# Patient Record
Sex: Male | Born: 1955
Health system: Southern US, Community
[De-identification: ages and names within clinical notes are randomized; demographics above are authoritative.]

## PROBLEM LIST (undated history)

## (undated) DIAGNOSIS — I10 Essential (primary) hypertension: Secondary | ICD-10-CM

## (undated) DIAGNOSIS — E785 Hyperlipidemia, unspecified: Secondary | ICD-10-CM

## (undated) DIAGNOSIS — K5790 Diverticulosis of intestine, part unspecified, without perforation or abscess without bleeding: Secondary | ICD-10-CM

## (undated) DIAGNOSIS — E1165 Type 2 diabetes mellitus with hyperglycemia: Secondary | ICD-10-CM

## (undated) DIAGNOSIS — M1990 Unspecified osteoarthritis, unspecified site: Secondary | ICD-10-CM

## (undated) DIAGNOSIS — IMO0002 Reserved for concepts with insufficient information to code with codable children: Secondary | ICD-10-CM

## (undated) HISTORY — DX: Type 2 diabetes mellitus with hyperglycemia: E11.65

## (undated) HISTORY — DX: Essential (primary) hypertension: I10

## (undated) HISTORY — DX: Reserved for concepts with insufficient information to code with codable children: IMO0002

## (undated) HISTORY — DX: Diverticulosis of intestine, part unspecified, without perforation or abscess without bleeding: K57.90

## (undated) HISTORY — DX: Unspecified osteoarthritis, unspecified site: M19.90

## (undated) HISTORY — DX: Hyperlipidemia, unspecified: E78.5

---

## 2005-07-30 ENCOUNTER — Ambulatory Visit (HOSPITAL_COMMUNITY): Admission: RE | Admit: 2005-07-30 | Discharge: 2005-07-30 | Payer: Self-pay | Admitting: Family Medicine

## 2009-06-22 ENCOUNTER — Emergency Department (HOSPITAL_COMMUNITY): Admission: EM | Admit: 2009-06-22 | Discharge: 2009-06-22 | Payer: Self-pay | Admitting: Pediatric Emergency Medicine

## 2009-06-25 ENCOUNTER — Emergency Department (HOSPITAL_COMMUNITY): Admission: EM | Admit: 2009-06-25 | Discharge: 2009-06-25 | Payer: Self-pay | Admitting: Family Medicine

## 2009-06-30 ENCOUNTER — Emergency Department (HOSPITAL_COMMUNITY): Admission: EM | Admit: 2009-06-30 | Discharge: 2009-06-30 | Payer: Self-pay | Admitting: Emergency Medicine

## 2009-07-06 ENCOUNTER — Emergency Department (HOSPITAL_COMMUNITY): Admission: EM | Admit: 2009-07-06 | Discharge: 2009-07-06 | Payer: Self-pay | Admitting: Family Medicine

## 2010-07-15 LAB — HM COLONOSCOPY

## 2012-09-26 ENCOUNTER — Telehealth: Payer: Self-pay | Admitting: Physician Assistant

## 2012-09-26 DIAGNOSIS — M1990 Unspecified osteoarthritis, unspecified site: Secondary | ICD-10-CM

## 2012-09-26 MED ORDER — CELECOXIB 100 MG PO CAPS: 100.0000 mg | ORAL_CAPSULE | Freq: Every day | ORAL | Status: DC

## 2012-09-26 NOTE — Telephone Encounter (Signed)
Medication refilled per protocol.Patient needs to be seen before any further refills 

## 2012-10-24 ENCOUNTER — Other Ambulatory Visit: Payer: Self-pay | Admitting: Physician Assistant

## 2012-10-25 ENCOUNTER — Telehealth: Payer: Self-pay | Admitting: Physician Assistant

## 2012-10-25 DIAGNOSIS — M1990 Unspecified osteoarthritis, unspecified site: Secondary | ICD-10-CM

## 2012-10-25 MED ORDER — CELECOXIB 100 MG PO CAPS: 100.0000 mg | ORAL_CAPSULE | Freq: Every day | ORAL | Status: DC

## 2012-10-25 NOTE — Telephone Encounter (Signed)
Medication refilled per protocol.Patient needs to be seen before any further refills 

## 2012-10-25 NOTE — Telephone Encounter (Signed)
celebrex 100mg  take 1 capsule by mouth daily last rf 09/26/12

## 2012-11-02 ENCOUNTER — Encounter: Payer: Self-pay | Admitting: Family Medicine

## 2012-11-02 ENCOUNTER — Ambulatory Visit (INDEPENDENT_AMBULATORY_CARE_PROVIDER_SITE_OTHER): Payer: BC Managed Care – PPO | Admitting: Family Medicine

## 2012-11-02 VITALS — BP 110/64 | HR 63 | Temp 98.0°F | Resp 16 | Wt 232.0 lb

## 2012-11-02 DIAGNOSIS — I10 Essential (primary) hypertension: Secondary | ICD-10-CM

## 2012-11-02 DIAGNOSIS — E785 Hyperlipidemia, unspecified: Secondary | ICD-10-CM

## 2012-11-02 DIAGNOSIS — M1990 Unspecified osteoarthritis, unspecified site: Secondary | ICD-10-CM

## 2012-11-02 DIAGNOSIS — R7303 Prediabetes: Secondary | ICD-10-CM

## 2012-11-02 DIAGNOSIS — R7309 Other abnormal glucose: Secondary | ICD-10-CM

## 2012-11-02 MED ORDER — VALSARTAN-HYDROCHLOROTHIAZIDE 160-25 MG PO TABS: 1.0000 | ORAL_TABLET | Freq: Every day | ORAL | Status: DC

## 2012-11-02 MED ORDER — ROSUVASTATIN CALCIUM 10 MG PO TABS: 10.0000 mg | ORAL_TABLET | Freq: Every day | ORAL | Status: DC

## 2012-11-02 MED ORDER — CELECOXIB 100 MG PO CAPS: 100.0000 mg | ORAL_CAPSULE | Freq: Every day | ORAL | Status: DC

## 2012-11-02 NOTE — Progress Notes (Signed)
Subjective:    Patient ID: James Ponce, male    DOB: December 21, 1955, 57 y.o.   MRN: [id number]  HPI Patient presents for followup of his prediabetes. At his last office visit he was found to have a fasting blood sugar of 131 with a hemoglobin A1c of 6.3. He took that seriously and has lost 11 pounds due to portion control, exercise, and limiting his carbs.  He denies polyuria, polydipsia, or blurred vision. He denies neuropathy in the feet  He also has hypertension which he is managing well with Diovan HCT 160/25 by mouth daily.  He denies chest pain shortness breath or dyspnea on exertion.  He also has hyperlipidemia which he manages with Crestor 10 mg by mouth daily. He denies any myalgias or right upper quadrant pain. Past Medical History  Diagnosis Date  . Hypertension   . Hyperlipidemia   . Arthritis   . Prediabetes   . Diverticulosis    Current Outpatient Prescriptions on File Prior to Visit  Medication Sig Dispense Refill  . azelastine (ASTELIN) 137 MCG/SPRAY nasal spray Place 2 sprays into the nose 2 (two) times daily. Use in each nostril as directed      . fish oil-omega-3 fatty acids 1000 MG capsule Take 3 g by mouth daily.      . fluticasone (FLONASE) 50 MCG/ACT nasal spray Place 1 spray into the nose daily. Each nostril      . loratadine (CLARITIN) 10 MG tablet Take 10 mg by mouth daily.      . Multiple Vitamin (MULTIVITAMIN) tablet Take 1 tablet by mouth daily.       No current facility-administered medications on file prior to visit.   Allergies  Allergen Reactions  . Lipitor (Atorvastatin) Other (See Comments)    myalgias  . Sulfa Antibiotics Rash   History   Social History  . Marital Status: Single    Spouse Name: N/A    Number of Children: N/A  . Years of Education: N/A   Occupational History  . Not on file.   Social History Main Topics  . Smoking status: Never Smoker   . Smokeless tobacco: Not on file  . Alcohol Use: No  . Drug Use: No  . Sexually  Active: Not on file   Other Topics Concern  . Not on file   Social History Narrative  . No narrative on file     Review of Systems    otherwise review of systems is negative Objective:   Physical Exam  Constitutional: He is oriented to person, place, and time. He appears well-developed and well-nourished.  Eyes: Conjunctivae and EOM are normal. Pupils are equal, round, and reactive to light.  Neck: Neck supple. No JVD present. No thyromegaly present.  Cardiovascular: Normal rate, normal heart sounds and intact distal pulses.  Exam reveals no gallop and no friction rub.   Pulmonary/Chest: Effort normal and breath sounds normal. No respiratory distress. He has no wheezes. He has no rales. He exhibits no tenderness.  Abdominal: Soft. Bowel sounds are normal. He exhibits no distension and no mass. There is no tenderness. There is no rebound and no guarding.  Lymphadenopathy:    He has no cervical adenopathy.  Neurological: He is alert and oriented to person, place, and time. No cranial nerve deficit. He exhibits normal muscle tone. Coordination normal.          Assessment & Plan:  1. Prediabetes Congratulated the patient on his weight loss. Recheck hemoglobin A1c. Emphasized a low  carbohydrate diet - Hemoglobin A1c  2. HTN (hypertension) Well-controlled no change. - Basic Metabolic Panel - Hepatic Function Panel  3. HLD (hyperlipidemia) Check a fasting lipid panel, his goal LDL is less than 100. - Hepatic Function Panel - Lipid Panel  4. Osteoarthritis Patient's Celebrex was refilled. - celecoxib (CELEBREX) 100 MG capsule; Take 1 capsule (100 mg total) by mouth daily.  Dispense: 30 capsule; Refill: 5

## 2012-11-03 LAB — LIPID PANEL
Triglycerides: 64 mg/dL (ref <150)
VLDL: 13 mg/dL (ref 0–40)

## 2012-11-03 LAB — BASIC METABOLIC PANEL
BUN: 10 mg/dL (ref 6–23)
Potassium: 4.3 mEq/L (ref 3.5–5.3)
Sodium: 140 mEq/L (ref 135–145)

## 2012-11-03 LAB — HEPATIC FUNCTION PANEL
AST: 17 U/L (ref 0–37)
Albumin: 4.6 g/dL (ref 3.5–5.2)
Alkaline Phosphatase: 70 U/L (ref 39–117)
Total Bilirubin: 0.6 mg/dL (ref 0.3–1.2)
Total Protein: 7.3 g/dL (ref 6.0–8.3)

## 2012-12-13 ENCOUNTER — Emergency Department (HOSPITAL_COMMUNITY)
Admission: EM | Admit: 2012-12-13 | Discharge: 2012-12-14 | Disposition: A | Discharge status: Home / Self Care | Payer: BC Managed Care – PPO | Attending: Emergency Medicine | Admitting: Emergency Medicine

## 2012-12-13 ENCOUNTER — Encounter (HOSPITAL_COMMUNITY): Payer: Self-pay | Admitting: Adult Health

## 2012-12-13 ENCOUNTER — Emergency Department (HOSPITAL_COMMUNITY): Payer: BC Managed Care – PPO

## 2012-12-13 DIAGNOSIS — Y929 Unspecified place or not applicable: Secondary | ICD-10-CM | POA: Insufficient documentation

## 2012-12-13 DIAGNOSIS — Z8719 Personal history of other diseases of the digestive system: Secondary | ICD-10-CM | POA: Insufficient documentation

## 2012-12-13 DIAGNOSIS — Z8739 Personal history of other diseases of the musculoskeletal system and connective tissue: Secondary | ICD-10-CM | POA: Insufficient documentation

## 2012-12-13 DIAGNOSIS — W268XXA Contact with other sharp object(s), not elsewhere classified, initial encounter: Secondary | ICD-10-CM | POA: Insufficient documentation

## 2012-12-13 DIAGNOSIS — I10 Essential (primary) hypertension: Secondary | ICD-10-CM | POA: Insufficient documentation

## 2012-12-13 DIAGNOSIS — Y9389 Activity, other specified: Secondary | ICD-10-CM | POA: Insufficient documentation

## 2012-12-13 DIAGNOSIS — S92919B Unspecified fracture of unspecified toe(s), initial encounter for open fracture: Secondary | ICD-10-CM | POA: Insufficient documentation

## 2012-12-13 DIAGNOSIS — S92911B Unspecified fracture of right toe(s), initial encounter for open fracture: Secondary | ICD-10-CM

## 2012-12-13 DIAGNOSIS — Y999 Unspecified external cause status: Secondary | ICD-10-CM | POA: Insufficient documentation

## 2012-12-13 DIAGNOSIS — E785 Hyperlipidemia, unspecified: Secondary | ICD-10-CM | POA: Insufficient documentation

## 2012-12-13 DIAGNOSIS — Z7982 Long term (current) use of aspirin: Secondary | ICD-10-CM | POA: Insufficient documentation

## 2012-12-13 DIAGNOSIS — E119 Type 2 diabetes mellitus without complications: Secondary | ICD-10-CM | POA: Insufficient documentation

## 2012-12-13 DIAGNOSIS — Z79899 Other long term (current) drug therapy: Secondary | ICD-10-CM | POA: Insufficient documentation

## 2012-12-13 NOTE — ED Notes (Signed)
Presents with laceration to right great toe, bleeding controlled. CMS intact. Last tetnus unknown.

## 2012-12-13 NOTE — ED Provider Notes (Signed)
History     CSN: [id number]  Arrival date & time 12/13/12  2212   First MD Initiated Contact with Patient 12/13/12 2249      Chief Complaint  Patient presents with  . Laceration    (Consider location/radiation/quality/duration/timing/severity/associated sxs/prior treatment) HPI Comments: Patient presents emergency department with chief complaint of right great toe laceration. States that he was using a gr auger, when his foot became stuck in the blade, and cut his right great toe. He was wearing still toed boots at the time. States that his pain is mild. He does not want anything for pain. States that he is tried pouring peroxide on the cut. States that it continues to bleed. Last tetanus shot is unknown.  The history is provided by the patient. No language interpreter was used.    Past Medical History  Diagnosis Date  . Hypertension   . Hyperlipidemia   . Arthritis   . Prediabetes   . Diverticulosis     History reviewed. No pertinent past surgical history.  History reviewed. No pertinent family history.  History  Substance Use Topics  . Smoking status: Never Smoker   . Smokeless tobacco: Not on file  . Alcohol Use: No      Review of Systems  All other systems reviewed and are negative.    Allergies  Lipitor and Sulfa antibiotics  Home Medications   Current Outpatient Rx  Name  Route  Sig  Dispense  Refill  . aspirin EC 81 MG tablet   Oral   Take 81 mg by mouth daily.         Marland Kitchen azelastine (ASTELIN) 137 MCG/SPRAY nasal spray   Nasal   Place 2 sprays into the nose 2 (two) times daily. Use in each nostril as directed         . celecoxib (CELEBREX) 100 MG capsule   Oral   Take 1 capsule (100 mg total) by mouth daily.   30 capsule   5   . fish oil-omega-3 fatty acids 1000 MG capsule   Oral   Take 1-2 g by mouth 2 (two) times daily. 2 capsules in morning, and 1 in evening, daily         . fluticasone (FLONASE) 50 MCG/ACT nasal spray   Nasal  Place 2 sprays into the nose daily. Each nostril         . Glucosamine-Chondroitin (GLUCOSAMINE CHONDR COMPLEX PO)   Oral   Take 1 tablet by mouth 2 (two) times daily.         Marland Kitchen loratadine (CLARITIN) 10 MG tablet   Oral   Take 10 mg by mouth daily.         . Multiple Vitamin (MULTIVITAMIN) tablet   Oral   Take 1 tablet by mouth daily.         . RED YEAST RICE EXTRACT PO   Oral   Take 500 mg by mouth 2 (two) times daily.          . rosuvastatin (CRESTOR) 10 MG tablet   Oral   Take 1 tablet (10 mg total) by mouth daily.   30 tablet   5   . valsartan-hydrochlorothiazide (DIOVAN-HCT) 160-25 MG per tablet   Oral   Take 1 tablet by mouth daily.   30 tablet   5     BP 148/76  Pulse 96  Temp(Src) 98.2 F (36.8 C) (Oral)  Resp 16  SpO2 96%  Physical Exam  Nursing note and vitals  reviewed. Constitutional: He is oriented to person, place, and time. He appears well-developed and well-nourished.  HENT:  Head: Normocephalic and atraumatic.  Eyes: Conjunctivae and EOM are normal.  Neck: Normal range of motion.  Cardiovascular: Normal rate.   Pulmonary/Chest: Effort normal.  Abdominal: He exhibits no distension.  Musculoskeletal: Normal range of motion.  Right great toe range of motion 5/5, strength 5/5  Neurological: He is alert and oriented to person, place, and time.  Skin: Skin is dry.  (2) 2 cm lacerations to the right great toe posteriorly, no evidence of foreign bodies, no tendon involvement  Psychiatric: He has a normal mood and affect. His behavior is normal. Judgment and thought content normal.    ED Course  Procedures (including critical care time)  Results for orders placed in visit on 11/02/12  BASIC METABOLIC PANEL      Result Value Range   Sodium 140  135 - 145 mEq/L   Potassium 4.3  3.5 - 5.3 mEq/L   Chloride 101  96 - 112 mEq/L   CO2 30  19 - 32 mEq/L   Glucose, Bld 122 (*) 70 - 99 mg/dL   BUN 10  6 - 23 mg/dL   Creat 5.78  4.69 - 6.29  mg/dL   Calcium 9.5  8.4 - 52.8 mg/dL  HEPATIC FUNCTION PANEL      Result Value Range   Total Bilirubin 0.6  0.3 - 1.2 mg/dL   Bilirubin, Direct 0.2  0.0 - 0.3 mg/dL   Indirect Bilirubin 0.4  0.0 - 0.9 mg/dL   Alkaline Phosphatase 70  39 - 117 U/L   AST 17  0 - 37 U/L   ALT 21  0 - 53 U/L   Total Protein 7.3  6.0 - 8.3 g/dL   Albumin 4.6  3.5 - 5.2 g/dL  HEMOGLOBIN U1L      Result Value Range   Hemoglobin A1C 5.6  <5.7 %   Mean Plasma Glucose 114  <117 mg/dL  LIPID PANEL      Result Value Range   Cholesterol 114  0 - 200 mg/dL   Triglycerides 64  <244 mg/dL   HDL 53  >01 mg/dL   Total CHOL/HDL Ratio 2.2     VLDL 13  0 - 40 mg/dL   LDL Cholesterol 48  0 - 99 mg/dL   Dg Foot Complete Right  12/14/2012   *RADIOLOGY REPORT*  Clinical Data: Injury to right first toe.  RIGHT FOOT COMPLETE - 3+ VIEW  Comparison: None.  Findings: Crush injury to the right first toe is noted with significant comminution of the distal phalanx.  Multiple fracture fragments show displacement and in the lateral projection, bone lies very near the surface of the skin dorsally at roughly the base of the first toe nail.  Correlation is suggested with any evidence clinically of an open fracture.  No soft tissue foreign body is identified.  IMPRESSION: Severely comminuted fracture of the first distal phalanx.  There is displacement of fracture fragments with one of the bony fragments very near the surface of the skin at the level of the base of the first toe nail.   Original Report Authenticated By: Irish Lack, M.D.      1. Open fracture of toe of right foot, initial encounter       MDM   Patient with open right great toe fracture. Patient seen by and discussed with Dr. Norlene Campbell.  Recommends ortho consult.  I spoke with Dr.  Ophelia Charter, who recommends thorough irrigation in the ED, and then outpatient follow-up in his office.  I irrigated the wound with  2 liters and high pressure.  No foreign bodies are seen.  Will  discharge with cipro and keflex.  Will give pain medicine.  Post op shoe and crutches.  Follow-up with Dr. Ophelia Charter tomorrow.       Roxy Horseman, PA-C 12/14/12 [phone]

## 2012-12-14 MED ORDER — CIPROFLOXACIN HCL 500 MG PO TABS
ORAL_TABLET | ORAL | Status: AC
  Filled 2012-12-14: qty 1

## 2012-12-14 MED ORDER — OXYCODONE-ACETAMINOPHEN 5-325 MG PO TABS
ORAL_TABLET | ORAL | Status: AC
  Filled 2012-12-14: qty 2

## 2012-12-14 NOTE — ED Provider Notes (Signed)
Medical screening examination/treatment/procedure(s) were conducted as a shared visit with non-physician practitioner(s) and myself.  I personally evaluated the patient during the encounter.  Pt with injury to right great toe from auger drill.  Fracture of distal phalax, appears to be open fracture.  D/w Ophelia Charter, who wishes good ED washout and no closure, f/u in clinic.  Olivia Mackie, MD 12/14/12 [phone]

## 2013-05-21 ENCOUNTER — Other Ambulatory Visit: Payer: Self-pay | Admitting: Family Medicine

## 2013-09-10 ENCOUNTER — Other Ambulatory Visit: Payer: Self-pay | Admitting: Family Medicine

## 2013-09-18 ENCOUNTER — Other Ambulatory Visit: Payer: Self-pay | Admitting: Family Medicine

## 2013-09-19 ENCOUNTER — Encounter: Payer: Self-pay | Admitting: Family Medicine

## 2013-09-19 NOTE — Telephone Encounter (Signed)
Medication refill for one time only.  Patient needs to be seen.  Letter sent for patient to call and schedule 

## 2013-09-20 ENCOUNTER — Other Ambulatory Visit: Payer: Self-pay | Admitting: Family Medicine

## 2013-09-20 NOTE — Telephone Encounter (Signed)
Refill appropriate and filled per protocol. 

## 2013-11-14 ENCOUNTER — Other Ambulatory Visit: Payer: Self-pay | Admitting: Family Medicine

## 2013-11-14 MED ORDER — CELECOXIB 100 MG PO CAPS: ORAL_CAPSULE | ORAL | Status: DC

## 2013-11-14 NOTE — Telephone Encounter (Signed)
Med refilled x 1 - pt needs office visit letter was mailed out in March for pt to make appt. Pt must make appt before any further refills

## 2013-11-29 ENCOUNTER — Encounter: Payer: Self-pay | Admitting: Family Medicine

## 2013-11-29 ENCOUNTER — Other Ambulatory Visit: Payer: Self-pay | Admitting: Family Medicine

## 2013-11-29 MED ORDER — ROSUVASTATIN CALCIUM 10 MG PO TABS: ORAL_TABLET | ORAL | Status: DC

## 2013-11-29 NOTE — Telephone Encounter (Signed)
Rx Refilled x 1 month will send letter for appt

## 2013-12-06 ENCOUNTER — Other Ambulatory Visit: Payer: BC Managed Care – PPO

## 2013-12-06 ENCOUNTER — Other Ambulatory Visit: Payer: Self-pay | Admitting: Family Medicine

## 2013-12-06 DIAGNOSIS — I10 Essential (primary) hypertension: Secondary | ICD-10-CM

## 2013-12-06 DIAGNOSIS — E785 Hyperlipidemia, unspecified: Secondary | ICD-10-CM

## 2013-12-06 DIAGNOSIS — R7303 Prediabetes: Secondary | ICD-10-CM

## 2013-12-06 DIAGNOSIS — Z79899 Other long term (current) drug therapy: Secondary | ICD-10-CM

## 2013-12-06 LAB — CBC WITH DIFFERENTIAL/PLATELET
BASOS ABS: 0.1 10*3/uL (ref 0.0–0.1)
Basophils Relative: 1 % (ref 0–1)
EOS PCT: 5 % (ref 0–5)
Eosinophils Absolute: 0.3 10*3/uL (ref 0.0–0.7)
HCT: 43.8 % (ref 39.0–52.0)
HEMOGLOBIN: 15.4 g/dL (ref 13.0–17.0)
LYMPHS ABS: 1.5 10*3/uL (ref 0.7–4.0)
LYMPHS PCT: 29 % (ref 12–46)
MCH: 29.8 pg (ref 26.0–34.0)
MCHC: 35.2 g/dL (ref 30.0–36.0)
MCV: 84.9 fL (ref 78.0–100.0)
MONO ABS: 0.4 10*3/uL (ref 0.1–1.0)
Monocytes Relative: 7 % (ref 3–12)
NEUTROS ABS: 3 10*3/uL (ref 1.7–7.7)
Neutrophils Relative %: 58 % (ref 43–77)
PLATELETS: 202 10*3/uL (ref 150–400)
RBC: 5.16 MIL/uL (ref 4.22–5.81)
RDW: 13.7 % (ref 11.5–15.5)
WBC: 5.2 10*3/uL (ref 4.0–10.5)

## 2013-12-06 LAB — COMPREHENSIVE METABOLIC PANEL
ALBUMIN: 4.1 g/dL (ref 3.5–5.2)
ALT: 33 U/L (ref 0–53)
AST: 22 U/L (ref 0–37)
Alkaline Phosphatase: 66 U/L (ref 39–117)
BUN: 18 mg/dL (ref 6–23)
CHLORIDE: 103 meq/L (ref 96–112)
CO2: 26 meq/L (ref 19–32)
CREATININE: 0.92 mg/dL (ref 0.50–1.35)
Calcium: 9.3 mg/dL (ref 8.4–10.5)
GLUCOSE: 131 mg/dL — AB (ref 70–99)
Potassium: 3.8 mEq/L (ref 3.5–5.3)
SODIUM: 139 meq/L (ref 135–145)
TOTAL PROTEIN: 6.5 g/dL (ref 6.0–8.3)
Total Bilirubin: 0.4 mg/dL (ref 0.2–1.2)

## 2013-12-06 LAB — HEMOGLOBIN A1C
Hgb A1c MFr Bld: 6.2 % — ABNORMAL HIGH (ref <5.7)
MEAN PLASMA GLUCOSE: 131 mg/dL — AB (ref <117)

## 2013-12-06 LAB — LIPID PANEL
CHOL/HDL RATIO: 3 ratio
Cholesterol: 145 mg/dL (ref 0–200)
HDL: 49 mg/dL (ref >39)
LDL CALC: 72 mg/dL (ref 0–99)
Triglycerides: 121 mg/dL (ref <150)
VLDL: 24 mg/dL (ref 0–40)

## 2013-12-07 ENCOUNTER — Ambulatory Visit (INDEPENDENT_AMBULATORY_CARE_PROVIDER_SITE_OTHER): Payer: BC Managed Care – PPO | Admitting: Family Medicine

## 2013-12-07 ENCOUNTER — Encounter: Payer: Self-pay | Admitting: Family Medicine

## 2013-12-07 VITALS — BP 140/70 | HR 78 | Temp 97.0°F | Resp 16 | Ht 69.0 in | Wt 248.0 lb

## 2013-12-07 DIAGNOSIS — R7303 Prediabetes: Secondary | ICD-10-CM

## 2013-12-07 DIAGNOSIS — E785 Hyperlipidemia, unspecified: Secondary | ICD-10-CM

## 2013-12-07 DIAGNOSIS — R7309 Other abnormal glucose: Secondary | ICD-10-CM

## 2013-12-07 DIAGNOSIS — I10 Essential (primary) hypertension: Secondary | ICD-10-CM

## 2013-12-07 MED ORDER — FLUTICASONE PROPIONATE 50 MCG/ACT NA SUSP: NASAL | Status: DC

## 2013-12-07 MED ORDER — ROSUVASTATIN CALCIUM 10 MG PO TABS: ORAL_TABLET | ORAL | Status: DC

## 2013-12-07 MED ORDER — LORATADINE 10 MG PO TABS
10.0000 mg | ORAL_TABLET | Freq: Every day | ORAL | Status: AC
Start: 2013-12-07 — End: ?

## 2013-12-07 MED ORDER — VALSARTAN-HYDROCHLOROTHIAZIDE 160-25 MG PO TABS: ORAL_TABLET | ORAL | Status: DC

## 2013-12-07 MED ORDER — AZELASTINE HCL 0.1 % NA SOLN: NASAL | Status: DC

## 2013-12-07 MED ORDER — CELECOXIB 100 MG PO CAPS
ORAL_CAPSULE | ORAL | Status: DC
Start: 2013-12-07 — End: 2014-10-29

## 2013-12-07 NOTE — Progress Notes (Signed)
Subjective:    Patient ID: James Ponce, male    DOB: [birth year], 57 y.o.   MRN: [id number]  HPI 11/02/13 Patient presents for followup of his prediabetes. At his last office visit he was found to have a fasting blood sugar of 131 with a hemoglobin A1c of 6.3. He took that seriously and has lost 11 pounds due to portion control, exercise, and limiting his carbs.  He denies polyuria, polydipsia, or blurred vision. He denies neuropathy in the feet.  He also has hypertension which he is managing well with Diovan HCT 160/25 by mouth daily.  He denies chest pain shortness breath or dyspnea on exertion.  He also has hyperlipidemia which he manages with Crestor 10 mg by mouth daily. He denies any myalgias or right upper quadrant pain.  At that time, my plan was: 1. Prediabetes Congratulated the patient on his weight loss. Recheck hemoglobin A1c. Emphasized a low carbohydrate diet - Hemoglobin A1c  2. HTN (hypertension) Well-controlled no change. - Basic Metabolic Panel - Hepatic Function Panel  3. HLD (hyperlipidemia) Check a fasting lipid panel, his goal LDL is less than 100. - Hepatic Function Panel - Lipid Panel  4. Osteoarthritis Patient's Celebrex was refilled. - celecoxib (CELEBREX) 100 MG capsule; Take 1 capsule (100 mg total) by mouth daily.  Dispense: 30 capsule; Refill: 5  12/07/13 He is here today for follow up.  Unfortunately, he has gained 16 pounds since his last ov.  His most recent labs are listed below: Lab on 12/06/2013  Component Date Value Ref Range Status  . WBC 12/06/2013 5.2  4.0 - 10.5 K/uL Final  . RBC 12/06/2013 5.16  4.22 - 5.81 MIL/uL Final  . Hemoglobin 12/06/2013 15.4  13.0 - 17.0 g/dL Final  . HCT 12/06/2013 43.8  39.0 - 52.0 % Final  . MCV 12/06/2013 84.9  78.0 - 100.0 fL Final  . MCH 12/06/2013 29.8  26.0 - 34.0 pg Final  . MCHC 12/06/2013 35.2  30.0 - 36.0 g/dL Final  . RDW 12/06/2013 13.7  11.5 - 15.5 % Final  . Platelets 12/06/2013 202  150 - 400  K/uL Final  . Neutrophils Relative % 12/06/2013 58  43 - 77 % Final  . Neutro Abs 12/06/2013 3.0  1.7 - 7.7 K/uL Final  . Lymphocytes Relative 12/06/2013 29  12 - 46 % Final  . Lymphs Abs 12/06/2013 1.5  0.7 - 4.0 K/uL Final  . Monocytes Relative 12/06/2013 7  3 - 12 % Final  . Monocytes Absolute 12/06/2013 0.4  0.1 - 1.0 K/uL Final  . Eosinophils Relative 12/06/2013 5  0 - 5 % Final  . Eosinophils Absolute 12/06/2013 0.3  0.0 - 0.7 K/uL Final  . Basophils Relative 12/06/2013 1  0 - 1 % Final  . Basophils Absolute 12/06/2013 0.1  0.0 - 0.1 K/uL Final  . Smear Review 12/06/2013 Criteria for review not met   Final  . Sodium 12/06/2013 139  135 - 145 mEq/L Final  . Potassium 12/06/2013 3.8  3.5 - 5.3 mEq/L Final  . Chloride 12/06/2013 103  96 - 112 mEq/L Final  . CO2 12/06/2013 26  19 - 32 mEq/L Final  . Glucose, Bld 12/06/2013 131* 70 - 99 mg/dL Final  . BUN 12/06/2013 18  6 - 23 mg/dL Final  . Creat 12/06/2013 0.92  0.50 - 1.35 mg/dL Final  . Total Bilirubin 12/06/2013 0.4  0.2 - 1.2 mg/dL Final  . Alkaline Phosphatase 12/06/2013 66  39 - 117  U/L Final  . AST 12/06/2013 22  0 - 37 U/L Final  . ALT 12/06/2013 33  0 - 53 U/L Final  . Total Protein 12/06/2013 6.5  6.0 - 8.3 g/dL Final  . Albumin 12/06/2013 4.1  3.5 - 5.2 g/dL Final  . Calcium 12/06/2013 9.3  8.4 - 10.5 mg/dL Final  . Cholesterol 12/06/2013 145  0 - 200 mg/dL Final   Comment: ATP III Classification:                                < 200        mg/dL        Desirable                               200 - 239     mg/dL        Borderline High                               >= 240        mg/dL        High                             . Triglycerides 12/06/2013 121  <150 mg/dL Final  . HDL 12/06/2013 49  >39 mg/dL Final  . Total CHOL/HDL Ratio 12/06/2013 3.0   Final  . VLDL 12/06/2013 24  0 - 40 mg/dL Final  . LDL Cholesterol 12/06/2013 72  0 - 99 mg/dL Final   Comment:                            Total Cholesterol/HDL  Ratio:CHD Risk                                                 Coronary Heart Disease Risk Table                                                                 Men       Women                                   1/2 Average Risk              3.4        3.3                                       Average Risk              5.0        4.4  2X Average Risk              9.6        7.1                                    3X Average Risk             23.4       11.0                          Use the calculated Patient Ratio above and the CHD Risk table                           to determine the patient's CHD Risk.                          ATP III Classification (LDL):                                < 100        mg/dL         Optimal                               100 - 129     mg/dL         Near or Above Optimal                               130 - 159     mg/dL         Borderline High                               160 - 189     mg/dL         High                                > 190        mg/dL         Very High                             . Hemoglobin A1C 12/06/2013 6.2* <5.7 % Final   Comment:                                                                                                 According to the ADA Clinical Practice Recommendations for 2011, when                          HbA1c is used as  a screening test:                                                       >=6.5%   Diagnostic of Diabetes Mellitus                                     (if abnormal result is confirmed)                                                     5.7-6.4%   Increased risk of developing Diabetes Mellitus                                                     References:Diagnosis and Classification of Diabetes Mellitus,Diabetes                          EXHB,7169,67(ELFYB 1):S62-S69 and Standards of Medical Care in                                  Diabetes - 2011,Diabetes OFBP,1025,85  (Suppl 1):S11-S61.                             . Mean Plasma Glucose 12/06/2013 131* <117 mg/dL Final   patient has slipped on his diet.  He has not been watching his carbohydrate intake. He has not been trying to achieve regular aerobic exercise. While his cholesterol is still excellent, his triglycerides and LDL have increased substantially over the last year. His colonoscopy is up-to-date. He is overdue for a PSA. He denies any chest pain shortness of breath or dyspnea on exertion. He denies any myalgias or right upper quadrant pain. His biggest problem is arthritis in his hands for which he takes Celebrex. This provides him some minimal relief   Past Medical History  Diagnosis Date  . Hypertension   . Hyperlipidemia   . Arthritis   . Prediabetes   . Diverticulosis    Current Outpatient Prescriptions on File Prior to Visit  Medication Sig Dispense Refill  . aspirin EC 81 MG tablet Take 81 mg by mouth daily.      Marland Kitchen azelastine (ASTELIN) 137 MCG/SPRAY nasal spray USE 2 SPRAYS IN EACH NOSTRIL TWICE DAILY  30 mL  0  . celecoxib (CELEBREX) 100 MG capsule TAKE 1 CAPSULE (100 MG TOTAL) BY MOUTH DAILY.  30 capsule  0  . fish oil-omega-3 fatty acids 1000 MG capsule Take 1-2 g by mouth 2 (two) times daily. 2 capsules in morning, and 1 in evening, daily      . fluticasone (FLONASE) 50 MCG/ACT nasal spray USE 2 SPRAYS EACH NOSTRIL DAILY  16 g  11  . Glucosamine-Chondroitin (GLUCOSAMINE CHONDR COMPLEX PO) Take 1 tablet by mouth 2 (two) times daily.      Marland Kitchen  loratadine (CLARITIN) 10 MG tablet Take 10 mg by mouth daily.      . Multiple Vitamin (MULTIVITAMIN) tablet Take 1 tablet by mouth daily.      . RED YEAST RICE EXTRACT PO Take 500 mg by mouth 2 (two) times daily.       . rosuvastatin (CRESTOR) 10 MG tablet TAKE 1 TABLET (10 MG TOTAL) BY MOUTH DAILY.  30 tablet  0  . valsartan-hydrochlorothiazide (DIOVAN-HCT) 160-25 MG per tablet TAKE 1 TABLET BY MOUTH DAILY.  30 tablet  5   No current  facility-administered medications on file prior to visit.   Allergies  Allergen Reactions  . Lipitor [Atorvastatin] Other (See Comments)    Muscle cramps, cant walk, sluggish  . Sulfa Antibiotics Itching and Rash   History   Social History  . Marital Status: Single    Spouse Name: N/A    Number of Children: N/A  . Years of Education: N/A   Occupational History  . Not on file.   Social History Main Topics  . Smoking status: Never Smoker   . Smokeless tobacco: Not on file  . Alcohol Use: No  . Drug Use: No  . Sexual Activity: Not on file   Other Topics Concern  . Not on file   Social History Narrative  . No narrative on file     Review of Systems     otherwise review of systems is negative Objective:   Physical Exam  Vitals reviewed. Constitutional: He is oriented to person, place, and time. He appears well-developed and well-nourished.  Eyes: Conjunctivae and EOM are normal. Pupils are equal, round, and reactive to light.  Neck: Neck supple. No JVD present. No thyromegaly present.  Cardiovascular: Normal rate, normal heart sounds and intact distal pulses.  Exam reveals no gallop and no friction rub.   Pulmonary/Chest: Effort normal and breath sounds normal. No respiratory distress. He has no wheezes. He has no rales. He exhibits no tenderness.  Abdominal: Soft. Bowel sounds are normal. He exhibits no distension and no mass. There is no tenderness. There is no rebound and no guarding.  Lymphadenopathy:    He has no cervical adenopathy.  Neurological: He is alert and oriented to person, place, and time. No cranial nerve deficit. He exhibits normal muscle tone. Coordination normal.          Assessment & Plan:  1. Prediabetes I explained to the patient the change in his labs is likely related to the change in his weight. I recommended a low carbohydrate diet, increasing exercise, 10-15 pound weight loss. Anticipate his hemoglobin A1c would be improved if he starts  maintaining a low carbohydrate diet and achieving regular aerobic exercise. 2. HTN (hypertension) Blood pressure is borderline today. I will make any changes in his medication at this time. I recommended again diet exercise and weight loss.  3. HLD (hyperlipidemia) Cholesterol is excellent. I made no changes in his Crestor. Continue current medications at the present dosages. Recheck in 6 months.

## 2013-12-08 ENCOUNTER — Encounter: Payer: Self-pay | Admitting: Family Medicine

## 2013-12-08 ENCOUNTER — Other Ambulatory Visit: Payer: Self-pay | Admitting: Family Medicine

## 2013-12-08 LAB — PSA: PSA: 0.73 ng/mL (ref <=4.00)

## 2013-12-11 NOTE — Telephone Encounter (Signed)
Refill appropriate and filled per protocol. 

## 2014-03-20 IMAGING — CR DG FOOT COMPLETE 3+V*R*
3 series · 3 of 3 positions shown · non-contrast
Comparison: None.

CLINICAL DATA: Injury to right first toe.

RIGHT FOOT COMPLETE - 3+ VIEW

[t foot ap right]
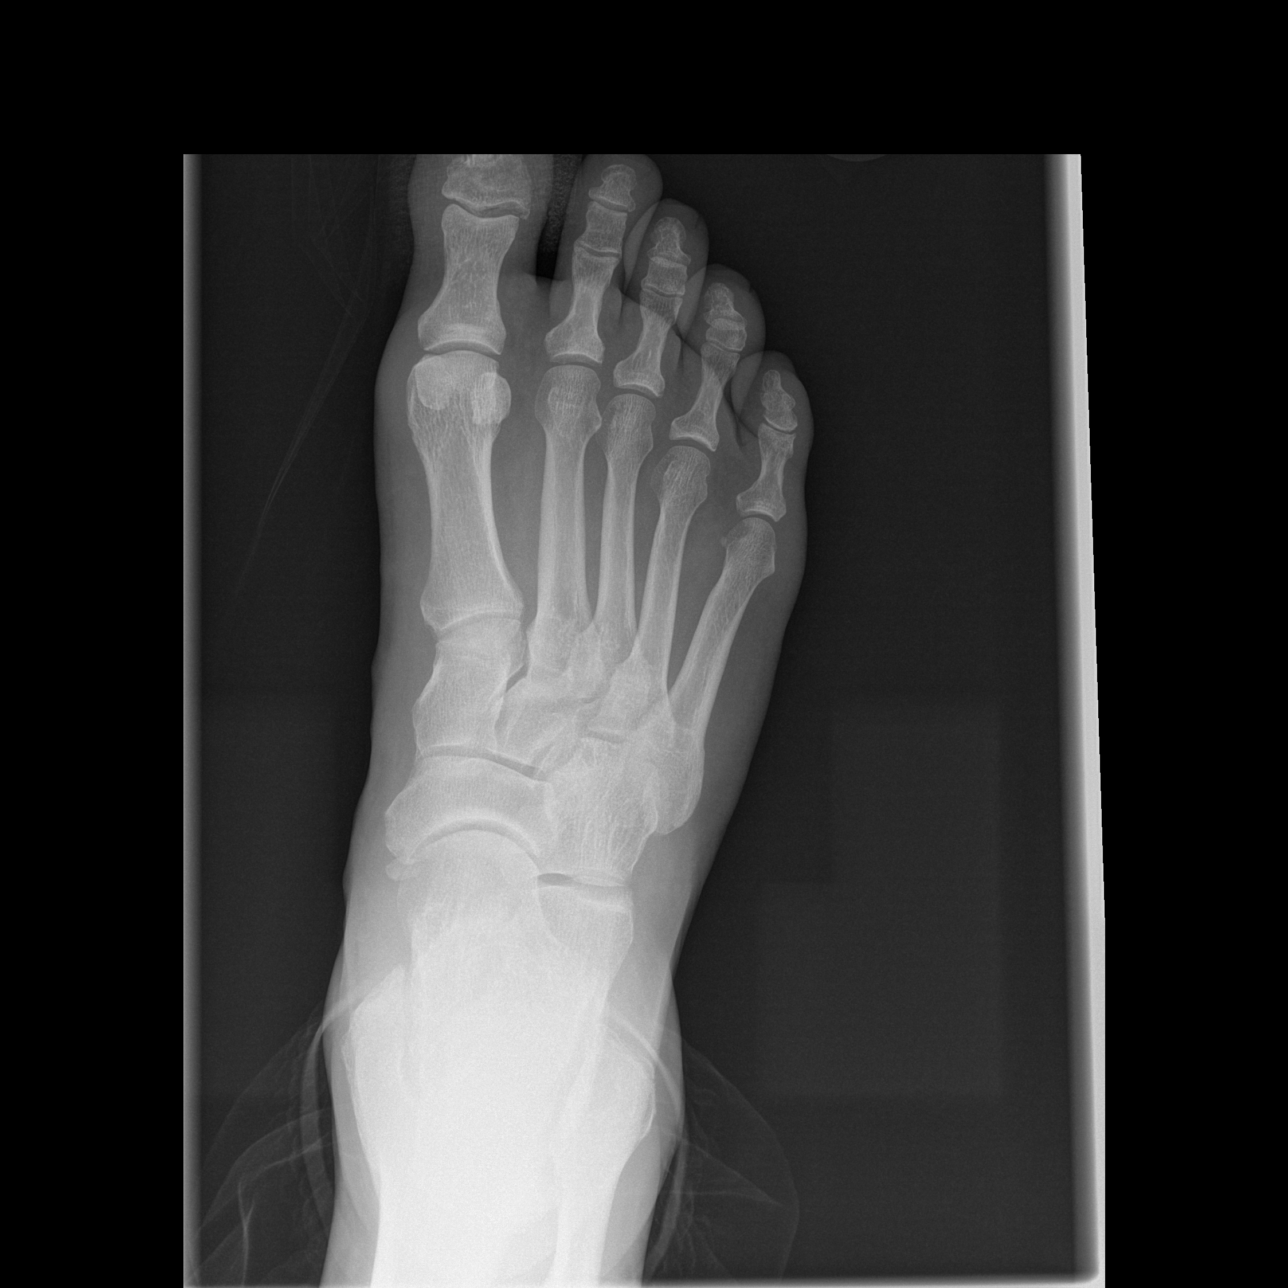

[t foot oblique right]
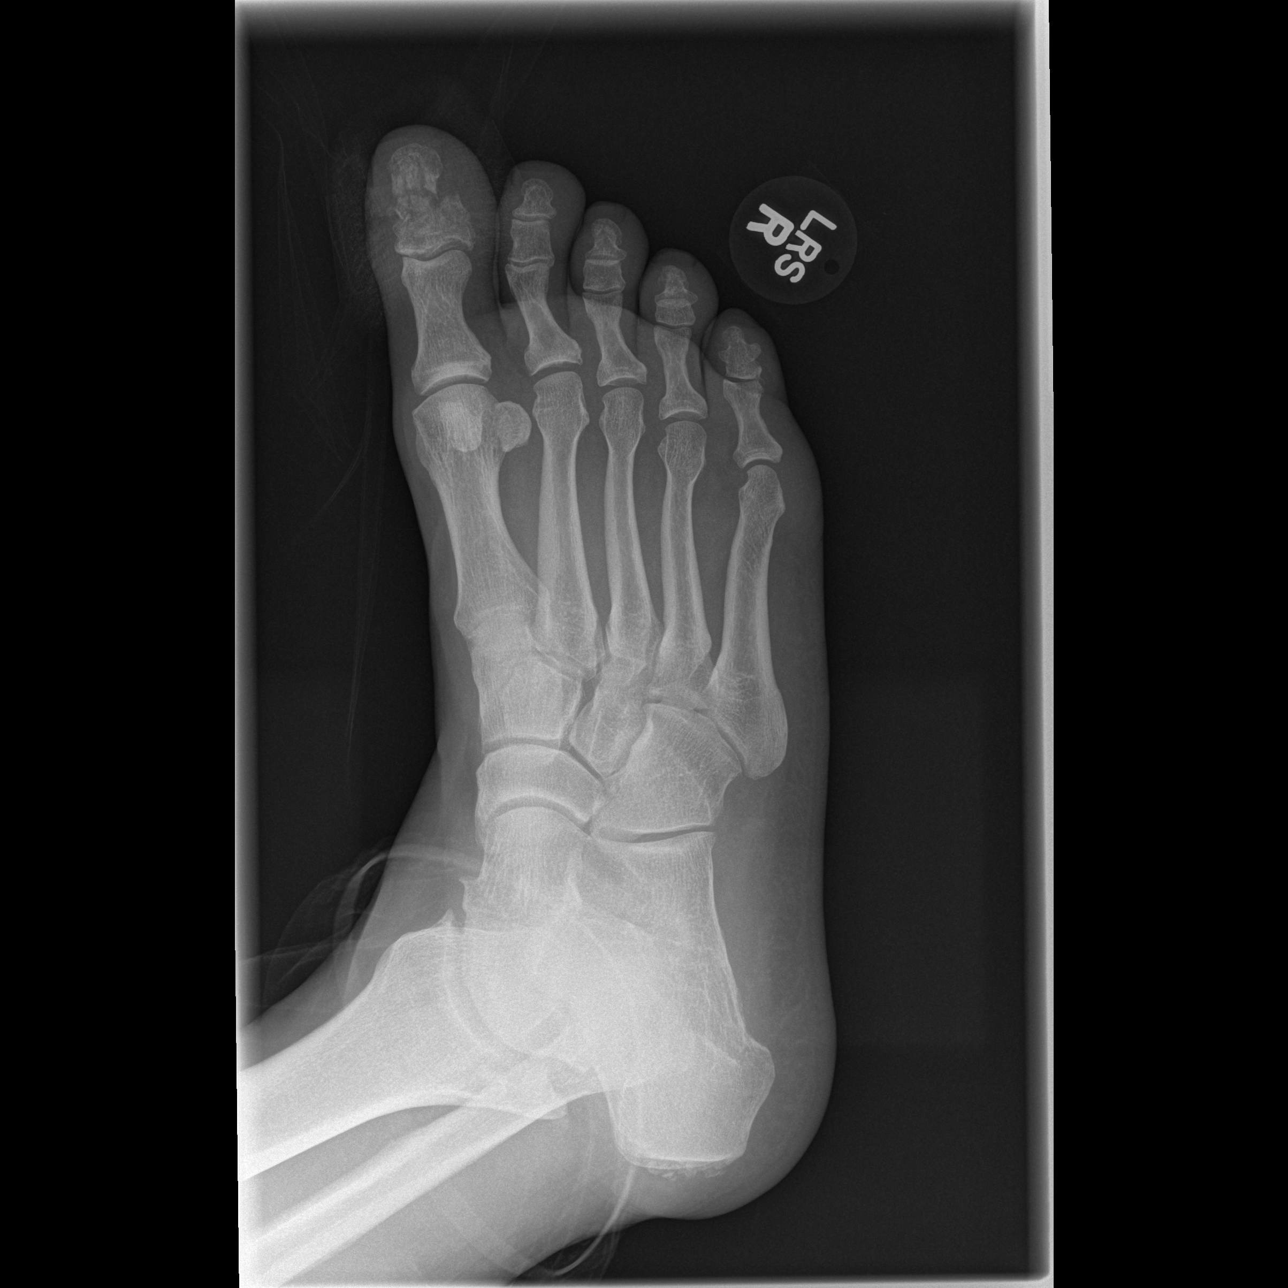

[t foot lat right]
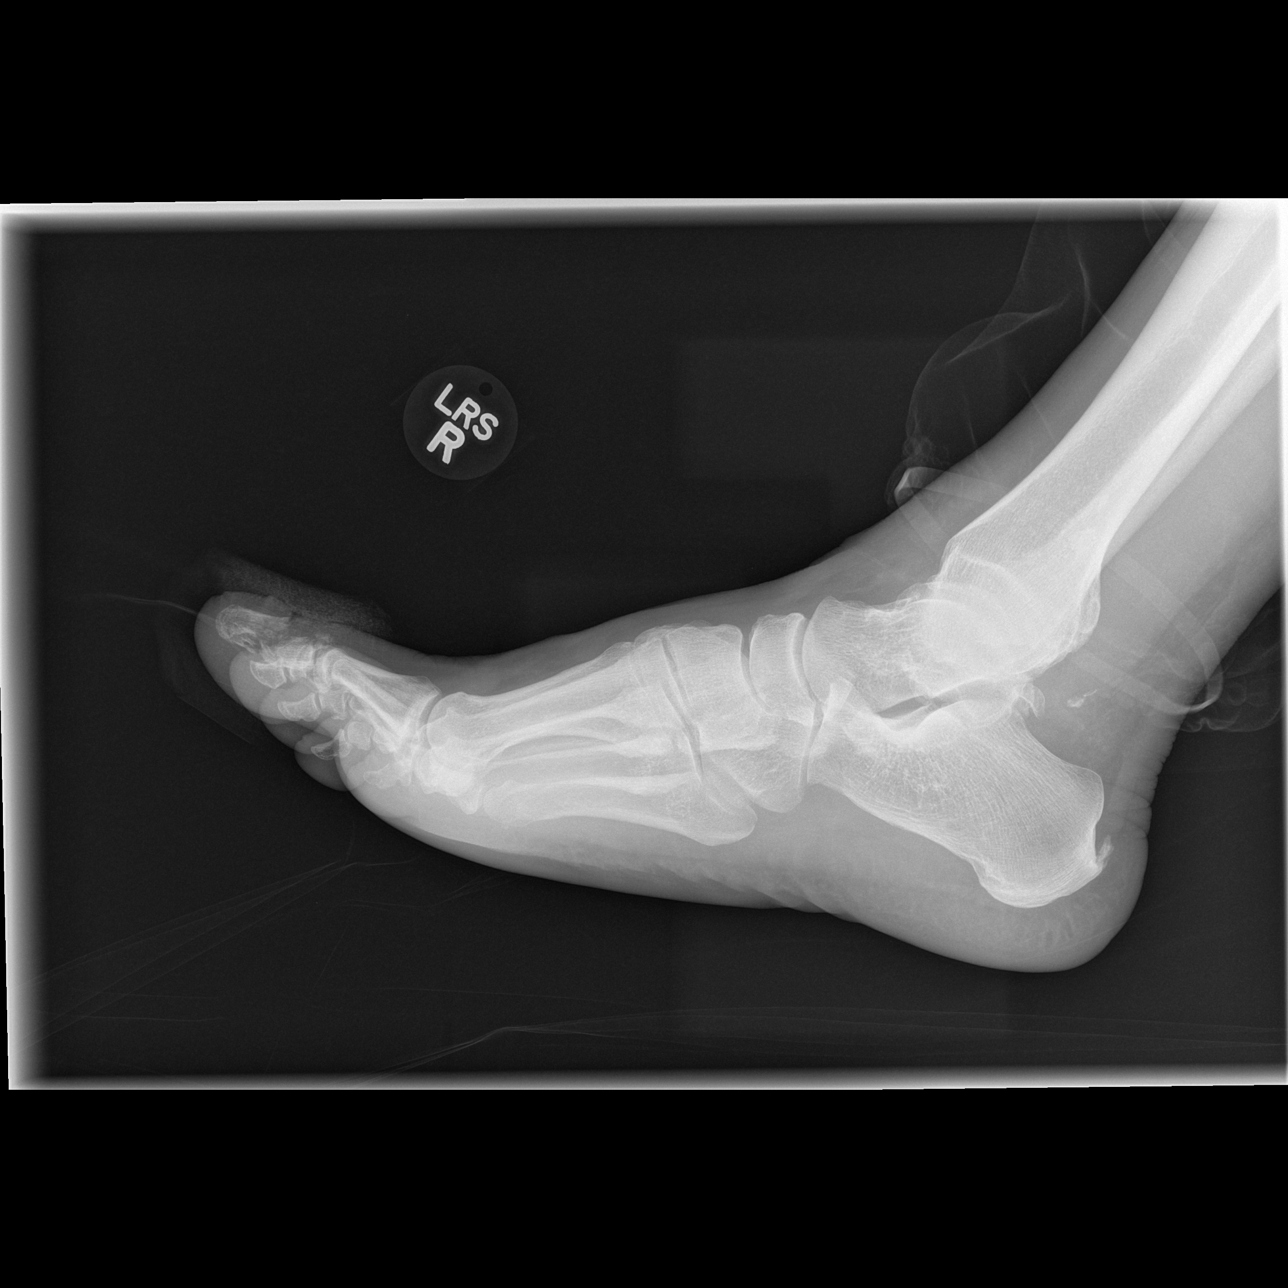

[3 of 3 positions shown; findings below may reference images not displayed]

FINDINGS: Crush injury to the right first toe is noted with
significant comminution of the distal phalanx.  Multiple fracture
fragments show displacement and in the lateral projection, bone
lies very near the surface of the skin dorsally at roughly the base
of the first toe nail.  Correlation is suggested with any evidence
clinically of an open fracture.  No soft tissue foreign body is
identified.
IMPRESSION: Severely comminuted fracture of the first distal phalanx.  There is
displacement of fracture fragments with one of the bony fragments
very near the surface of the skin at the level of the base of the
first toe nail.

## 2014-05-17 ENCOUNTER — Ambulatory Visit (INDEPENDENT_AMBULATORY_CARE_PROVIDER_SITE_OTHER): Payer: BC Managed Care – PPO | Admitting: *Deleted

## 2014-05-17 DIAGNOSIS — Z23 Encounter for immunization: Secondary | ICD-10-CM

## 2014-05-17 NOTE — Progress Notes (Signed)
Patient ID: James Ponce, male   DOB: 01/22/1956, 58 y.o.   MRN: [id number] Patient seen in office for Influenza Vaccination.   Tolerated IM administration well.   Immunization history updated.

## 2014-06-25 ENCOUNTER — Encounter: Payer: Self-pay | Admitting: Physician Assistant

## 2014-06-25 ENCOUNTER — Ambulatory Visit (INDEPENDENT_AMBULATORY_CARE_PROVIDER_SITE_OTHER): Payer: BC Managed Care – PPO | Admitting: Physician Assistant

## 2014-06-25 VITALS — BP 144/80 | HR 88 | Temp 98.6°F | Resp 18 | Wt 253.0 lb

## 2014-06-25 DIAGNOSIS — B9689 Other specified bacterial agents as the cause of diseases classified elsewhere: Secondary | ICD-10-CM

## 2014-06-25 DIAGNOSIS — J988 Other specified respiratory disorders: Secondary | ICD-10-CM

## 2014-06-25 MED ORDER — AZITHROMYCIN 250 MG PO TABS: ORAL_TABLET | ORAL | Status: DC

## 2014-06-25 NOTE — Progress Notes (Signed)
Patient ID: Gillian ShieldsMichael Chumley MRN: [id number], DOB: 12/26/1955, 58 y.o. Date of Encounter: 06/25/2014, 3:07 PM    Chief Complaint:  Chief Complaint  Patient presents with  . chest congestion/cough    wife on abx for bronchitis     HPI: 58 y.o. year old white male here secondary to above symptoms.   Says he is mostly having chest congestion and cough but also some head/nasal congestion.  No fever/chills. No sore throat, no ear ache.      Home Meds:   Outpatient Prescriptions Prior to Visit  Medication Sig Dispense Refill  . aspirin EC 81 MG tablet Take 81 mg by mouth daily.    Marland Kitchen. azelastine (ASTELIN) 0.1 % nasal spray USE 2 SPRAYS IN EACH NOSTRIL TWICE DAILY 30 mL 6  . celecoxib (CELEBREX) 100 MG capsule TAKE 1 CAPSULE (100 MG TOTAL) BY MOUTH DAILY. 30 capsule 11  . fish oil-omega-3 fatty acids 1000 MG capsule Take 1-2 g by mouth 2 (two) times daily. 2 capsules in morning, and 1 in evening, daily    . fluticasone (FLONASE) 50 MCG/ACT nasal spray USE 2 SPRAYS EACH NOSTRIL DAILY 16 g 11  . Glucosamine-Chondroitin (GLUCOSAMINE CHONDR COMPLEX PO) Take 1 tablet by mouth 2 (two) times daily.    Marland Kitchen. loratadine (CLARITIN) 10 MG tablet Take 1 tablet (10 mg total) by mouth daily. 30 tablet 11  . Multiple Vitamin (MULTIVITAMIN) tablet Take 1 tablet by mouth daily.    . RED YEAST RICE EXTRACT PO Take 500 mg by mouth 2 (two) times daily.     . rosuvastatin (CRESTOR) 10 MG tablet TAKE 1 TABLET (10 MG TOTAL) BY MOUTH DAILY. 30 tablet 11  . valsartan-hydrochlorothiazide (DIOVAN-HCT) 160-25 MG per tablet TAKE 1 TABLET BY MOUTH DAILY. 30 tablet 11   No facility-administered medications prior to visit.    Allergies:  Allergies  Allergen Reactions  . Lipitor [Atorvastatin] Other (See Comments)    Muscle cramps, cant walk, sluggish  . Sulfa Antibiotics Itching and Rash      Review of Systems: See HPI for pertinent ROS. All other ROS negative.    Physical Exam: Blood pressure 144/80, pulse  88, temperature 98.6 F (37 C), temperature source Oral, resp. rate 18, weight 253 lb (114.76 kg)., Body mass index is 37.34 kg/(m^2). General:  WNWD WM. Appears in no acute distress. HEENT: Normocephalic, atraumatic, eyes without discharge, sclera non-icteric, nares are without discharge. Bilateral auditory canals clear, TM's are without perforation, pearly grey and translucent with reflective cone of light bilaterally. Oral cavity moist, posterior pharynx without exudate, erythema, peritonsillar abscess. Minimal tenderness with percussion left maxillary sinus. No tenderness with palpation of right maxillary sinus or frontal sinuses.   Neck: Supple. No thyromegaly. No lymphadenopathy. Lungs: Clear bilaterally to auscultation without wheezes, rales, or rhonchi. Breathing is unlabored. Heart: Regular rhythm. No murmurs, rubs, or gallops. Msk:  Strength and tone normal for age. Extremities/Skin: Warm and dry.  No rashes. Neuro: Alert and oriented X 3. Moves all extremities spontaneously. Gait is normal. CNII-XII grossly in tact. Psych:  Responds to questions appropriately with a normal affect.     ASSESSMENT AND PLAN:  58 y.o. year old male with  1. Bacterial respiratory infection - azithromycin (ZITHROMAX) 250 MG tablet; Day 1: Take 2 daily.  Days 2-5: Take 1 daily.  Dispense: 6 tablet; Refill: 0 Take abx as directed. F/U if symptoms not resolved 1 week after completion of abx.   [id number], Tricha Ruggirello Beth RosmanDixon, [place], [facility] 06/25/2014  3:07 PM

## 2014-10-19 ENCOUNTER — Telehealth: Payer: Self-pay | Admitting: Family Medicine

## 2014-10-19 NOTE — Telephone Encounter (Signed)
Patients wife calling with some questions about bloodwork if we could call her back at [phone]

## 2014-10-19 NOTE — Telephone Encounter (Signed)
Called and spoke to wife and she said already has answer to her question

## 2014-10-29 ENCOUNTER — Other Ambulatory Visit: Payer: Self-pay | Admitting: Family Medicine

## 2014-11-25 ENCOUNTER — Other Ambulatory Visit: Payer: Self-pay | Admitting: Family Medicine

## 2014-11-26 ENCOUNTER — Telehealth: Payer: Self-pay | Admitting: Family Medicine

## 2014-11-26 NOTE — Telephone Encounter (Signed)
PA submitted through [email or web address]  - [place]  Pt has been on Crestor since 2014

## 2014-11-27 NOTE — Telephone Encounter (Signed)
Received fax from insurance that name brand is covered  - faxed to pharm to run rx as name brand 11/26/14

## 2014-12-17 ENCOUNTER — Telehealth: Payer: Self-pay | Admitting: Family Medicine

## 2014-12-17 NOTE — Telephone Encounter (Signed)
[phone] PT is coming in Friday for an apt and states that he thinks he is due for lab work. Can you let pt know if he does need to have lab work so he knows if he needs to be fasting or not.

## 2014-12-20 NOTE — Telephone Encounter (Signed)
Pt aware to come fasting that it looks like he needs BW - no BW since 2015

## 2014-12-21 ENCOUNTER — Encounter: Payer: Self-pay | Admitting: Family Medicine

## 2014-12-21 ENCOUNTER — Other Ambulatory Visit: Payer: BLUE CROSS/BLUE SHIELD

## 2014-12-21 ENCOUNTER — Ambulatory Visit (INDEPENDENT_AMBULATORY_CARE_PROVIDER_SITE_OTHER): Payer: BLUE CROSS/BLUE SHIELD | Admitting: Family Medicine

## 2014-12-21 VITALS — BP 138/82 | HR 82 | Temp 97.7°F | Resp 18 | Ht 69.0 in | Wt 253.0 lb

## 2014-12-21 DIAGNOSIS — L089 Local infection of the skin and subcutaneous tissue, unspecified: Secondary | ICD-10-CM

## 2014-12-21 DIAGNOSIS — R7303 Prediabetes: Secondary | ICD-10-CM

## 2014-12-21 DIAGNOSIS — I10 Essential (primary) hypertension: Secondary | ICD-10-CM

## 2014-12-21 DIAGNOSIS — L723 Sebaceous cyst: Secondary | ICD-10-CM

## 2014-12-21 DIAGNOSIS — E785 Hyperlipidemia, unspecified: Secondary | ICD-10-CM

## 2014-12-21 DIAGNOSIS — M7021 Olecranon bursitis, right elbow: Secondary | ICD-10-CM

## 2014-12-21 LAB — COMPLETE METABOLIC PANEL WITH GFR
ALK PHOS: 71 U/L (ref 39–117)
ALT: 34 U/L (ref 0–53)
AST: 25 U/L (ref 0–37)
Albumin: 4.4 g/dL (ref 3.5–5.2)
BILIRUBIN TOTAL: 0.7 mg/dL (ref 0.2–1.2)
BUN: 16 mg/dL (ref 6–23)
CALCIUM: 9.6 mg/dL (ref 8.4–10.5)
CHLORIDE: 100 meq/L (ref 96–112)
CO2: 29 mEq/L (ref 19–32)
CREATININE: 0.95 mg/dL (ref 0.50–1.35)
GFR, Est African American: >89 mL/min
GFR, Est Non African American: 88 mL/min
GLUCOSE: 140 mg/dL — AB (ref 70–99)
Potassium: 4.3 mEq/L (ref 3.5–5.3)
Sodium: 139 mEq/L (ref 135–145)
TOTAL PROTEIN: 7 g/dL (ref 6.0–8.3)

## 2014-12-21 LAB — LIPID PANEL
CHOL/HDL RATIO: 2.7 ratio
Cholesterol: 125 mg/dL (ref 0–200)
HDL: 47 mg/dL (ref >=40)
LDL CALC: 56 mg/dL (ref 0–99)
Triglycerides: 109 mg/dL (ref <150)
VLDL: 22 mg/dL (ref 0–40)

## 2014-12-21 LAB — CBC WITH DIFFERENTIAL/PLATELET
Basophils Absolute: 0.1 10*3/uL (ref 0.0–0.1)
Basophils Relative: 1 % (ref 0–1)
EOS ABS: 0.2 10*3/uL (ref 0.0–0.7)
Eosinophils Relative: 3 % (ref 0–5)
HEMATOCRIT: 45.8 % (ref 39.0–52.0)
Hemoglobin: 15.5 g/dL (ref 13.0–17.0)
LYMPHS PCT: 24 % (ref 12–46)
Lymphs Abs: 1.6 10*3/uL (ref 0.7–4.0)
MCH: 30.1 pg (ref 26.0–34.0)
MCHC: 33.8 g/dL (ref 30.0–36.0)
MCV: 88.9 fL (ref 78.0–100.0)
MONO ABS: 0.5 10*3/uL (ref 0.1–1.0)
MPV: 9.5 fL (ref 8.6–12.4)
Monocytes Relative: 8 % (ref 3–12)
NEUTROS ABS: 4.2 10*3/uL (ref 1.7–7.7)
NEUTROS PCT: 64 % (ref 43–77)
Platelets: 231 10*3/uL (ref 150–400)
RBC: 5.15 MIL/uL (ref 4.22–5.81)
RDW: 13.5 % (ref 11.5–15.5)
WBC: 6.5 10*3/uL (ref 4.0–10.5)

## 2014-12-21 MED ORDER — DOXYCYCLINE HYCLATE 100 MG PO TABS: 100.0000 mg | ORAL_TABLET | Freq: Two times a day (BID) | ORAL | Status: DC

## 2014-12-21 NOTE — Progress Notes (Signed)
Subjective:    Patient ID: James Ponce, male    DOB: October 15, 1955, 59 y.o.   MRN: [id number]  HPI Patient has had bursitis in his left olecranon bursa for 1 week. He has a tense swollen right olecranon bursa. There is no erythema. There is no warmth. It is tender. He is requesting aspiration. He also has an erythematous patch on the posterior aspect of his right neck which is indurated red warm and tender. It is possible he may have had an infected sebaceous cyst there although there is no obvious fluid collection that needs to be drained. Past Medical History  Diagnosis Date  . Hypertension   . Hyperlipidemia   . Arthritis   . Prediabetes   . Diverticulosis    No past surgical history on file. Current Outpatient Prescriptions on File Prior to Visit  Medication Sig Dispense Refill  . aspirin EC 81 MG tablet Take 81 mg by mouth daily.    Marland Kitchen azelastine (ASTELIN) 0.1 % nasal spray USE 2 SPRAYS IN EACH NOSTRIL TWICE DAILY 30 mL 6  . celecoxib (CELEBREX) 100 MG capsule TAKE ONE CAPSULE BY MOUTH EVERY DAY 30 capsule 10  . CRESTOR 10 MG tablet TAKE 1 TABLET (10 MG TOTAL) BY MOUTH DAILY. 30 tablet 0  . fish oil-omega-3 fatty acids 1000 MG capsule Take 1-2 g by mouth 2 (two) times daily. 2 capsules in morning, and 1 in evening, daily    . fluticasone (FLONASE) 50 MCG/ACT nasal spray USE 2 SPRAYS EACH NOSTRIL DAILY 16 g 11  . Glucosamine-Chondroitin (GLUCOSAMINE CHONDR COMPLEX PO) Take 1 tablet by mouth 2 (two) times daily.    Marland Kitchen loratadine (CLARITIN) 10 MG tablet Take 1 tablet (10 mg total) by mouth daily. 30 tablet 11  . Multiple Vitamin (MULTIVITAMIN) tablet Take 1 tablet by mouth daily.    . RED YEAST RICE EXTRACT PO Take 500 mg by mouth 2 (two) times daily.     . valsartan-hydrochlorothiazide (DIOVAN-HCT) 160-25 MG per tablet TAKE 1 TABLET BY MOUTH DAILY. 30 tablet 11   No current facility-administered medications on file prior to visit.   Allergies  Allergen Reactions  . Lipitor  [Atorvastatin] Other (See Comments)    Muscle cramps, cant walk, sluggish  . Sulfa Antibiotics Itching and Rash   History   Social History  . Marital Status: Single    Spouse Name: N/A  . Number of Children: N/A  . Years of Education: N/A   Occupational History  . Not on file.   Social History Main Topics  . Smoking status: Never Smoker   . Smokeless tobacco: Not on file  . Alcohol Use: No  . Drug Use: No  . Sexual Activity: Not on file   Other Topics Concern  . Not on file   Social History Narrative      Review of Systems  All other systems reviewed and are negative.      Objective:   Physical Exam  Cardiovascular: Normal rate, regular rhythm and normal heart sounds.   Pulmonary/Chest: Effort normal and breath sounds normal.  Musculoskeletal:       Right elbow: He exhibits decreased range of motion and swelling.  Skin: There is erythema.  Vitals reviewed.         Assessment & Plan:  Infected sebaceous cyst - Plan: doxycycline (VIBRA-TABS) 100 MG tablet  Olecranon bursitis, right  Patient has right olecranon bursitis. First I anesthetized the bursa using 0.1% lidocaine without epinephrine. I then aspirated 8  mL of bloody serous fluid from the bursa using an 18-gauge needle and sterile technique. I then injected the olecranon bursa with 1 mL of 40 mg per mL Kenalog. I then applied a pressure dressing. I recommended the patient reapply pressure dressing everyday for the next week to prevent reaccumulation. On the posterior aspect of his right neck is a erythematous patch of skin about the size of a silver dollar. It is warm red tender and indurated. Patient states that he did have a sebaceous cyst there in the past but this appears to have turned into some type of cellulitis. There is no obvious abscess today. I will treat the patient with doxycycline 100 mg by mouth twice a day for 10 days.  Return immediately if a fluid collection develops that requires incision  and drainage

## 2014-12-22 LAB — HEMOGLOBIN A1C
Hgb A1c MFr Bld: 15.8 % — ABNORMAL HIGH (ref <5.7)
MEAN PLASMA GLUCOSE: 407 mg/dL — AB (ref <117)

## 2014-12-26 ENCOUNTER — Encounter: Payer: Self-pay | Admitting: Family Medicine

## 2014-12-28 ENCOUNTER — Encounter: Payer: Self-pay | Admitting: Family Medicine

## 2014-12-28 ENCOUNTER — Ambulatory Visit (INDEPENDENT_AMBULATORY_CARE_PROVIDER_SITE_OTHER): Payer: BLUE CROSS/BLUE SHIELD | Admitting: Family Medicine

## 2014-12-28 VITALS — BP 132/78 | HR 88 | Temp 98.2°F | Resp 18 | Ht 70.0 in | Wt 245.0 lb

## 2014-12-28 DIAGNOSIS — R7309 Other abnormal glucose: Secondary | ICD-10-CM

## 2014-12-28 DIAGNOSIS — R739 Hyperglycemia, unspecified: Secondary | ICD-10-CM

## 2014-12-28 LAB — HEMOGLOBIN A1C, FINGERSTICK: Hgb A1C (fingerstick): 6.7 % — ABNORMAL HIGH (ref <5.7)

## 2014-12-28 LAB — HEMOGLOBIN A1C
Hgb A1c MFr Bld: 6.8 % — ABNORMAL HIGH (ref <5.7)
Mean Plasma Glucose: 148 mg/dL — ABNORMAL HIGH (ref <117)

## 2014-12-28 NOTE — Progress Notes (Signed)
Subjective:    Patient ID: James Ponce, male    DOB: 1955-07-17, 59 y.o.   MRN: [id number]  HPI Please see my last office visit. Patient has a history of prediabetes. However his most recent hemoglobin A1c returned 15.8. Therefore I had the patient come in today urgently for recheck of his hemoglobin A1c and to discuss insulin. Thankfully, the patient's hemoglobin A1c is only 6.7. Therefore the lab work was obviously an error. I will have the patient repeat all of his lab work today. I have also asked the lab to wave the fever his blood work as it is obvious that his previous blood work was incorrect. Therefore the patient does not need insulin. Past Medical History  Diagnosis Date  . Hypertension   . Hyperlipidemia   . Arthritis   . Prediabetes   . Diverticulosis    No past surgical history on file. Current Outpatient Prescriptions on File Prior to Visit  Medication Sig Dispense Refill  . aspirin EC 81 MG tablet Take 81 mg by mouth daily.    Marland Kitchen azelastine (ASTELIN) 0.1 % nasal spray USE 2 SPRAYS IN EACH NOSTRIL TWICE DAILY 30 mL 6  . celecoxib (CELEBREX) 100 MG capsule TAKE ONE CAPSULE BY MOUTH EVERY DAY 30 capsule 10  . CRESTOR 10 MG tablet TAKE 1 TABLET (10 MG TOTAL) BY MOUTH DAILY. 30 tablet 0  . doxycycline (VIBRA-TABS) 100 MG tablet Take 1 tablet (100 mg total) by mouth 2 (two) times daily. 20 tablet 0  . fish oil-omega-3 fatty acids 1000 MG capsule Take 1-2 g by mouth 2 (two) times daily. 2 capsules in morning, and 1 in evening, daily    . fluticasone (FLONASE) 50 MCG/ACT nasal spray USE 2 SPRAYS EACH NOSTRIL DAILY 16 g 11  . Glucosamine-Chondroitin (GLUCOSAMINE CHONDR COMPLEX PO) Take 1 tablet by mouth 2 (two) times daily.    Marland Kitchen loratadine (CLARITIN) 10 MG tablet Take 1 tablet (10 mg total) by mouth daily. 30 tablet 11  . Multiple Vitamin (MULTIVITAMIN) tablet Take 1 tablet by mouth daily.    . RED YEAST RICE EXTRACT PO Take 500 mg by mouth 2 (two) times daily.     .  valsartan-hydrochlorothiazide (DIOVAN-HCT) 160-25 MG per tablet TAKE 1 TABLET BY MOUTH DAILY. 30 tablet 11   No current facility-administered medications on file prior to visit.   Allergies  Allergen Reactions  . Lipitor [Atorvastatin] Other (See Comments)    Muscle cramps, cant walk, sluggish  . Sulfa Antibiotics Itching and Rash   History   Social History  . Marital Status: Single    Spouse Name: N/A  . Number of Children: N/A  . Years of Education: N/A   Occupational History  . Not on file.   Social History Main Topics  . Smoking status: Never Smoker   . Smokeless tobacco: Not on file  . Alcohol Use: No  . Drug Use: No  . Sexual Activity: Not on file   Other Topics Concern  . Not on file   Social History Narrative      Review of Systems  All other systems reviewed and are negative.      Objective:   Physical Exam  Cardiovascular: Normal rate, regular rhythm and normal heart sounds.   Pulmonary/Chest: Effort normal and breath sounds normal. No respiratory distress. He has no wheezes. He has no rales.  Abdominal: Soft. Bowel sounds are normal.  Vitals reviewed.         Assessment & Plan:  Elevated blood sugar - Plan: Hemoglobin A1C, fingerstick, COMPLETE METABOLIC PANEL WITH GFR, Lipid panel, Hemoglobin A1c  Patient's previous blood test is erroneous. Hemoglobin A1c is acceptable at 6.7. We did discuss diet exercise and weight loss to help manage his diabetes. I would recheck it in 6 months. However the patient has no indication for insulin at this time.

## 2014-12-29 LAB — LIPID PANEL
Cholesterol: 138 mg/dL (ref 0–200)
HDL: 57 mg/dL (ref >=40)
LDL Cholesterol: 67 mg/dL (ref 0–99)
Total CHOL/HDL Ratio: 2.4 Ratio
Triglycerides: 68 mg/dL (ref <150)
VLDL: 14 mg/dL (ref 0–40)

## 2014-12-29 LAB — COMPLETE METABOLIC PANEL WITH GFR
ALBUMIN: 4.4 g/dL (ref 3.5–5.2)
ALT: 30 U/L (ref 0–53)
AST: 25 U/L (ref 0–37)
Alkaline Phosphatase: 83 U/L (ref 39–117)
BUN: 19 mg/dL (ref 6–23)
CO2: 26 mEq/L (ref 19–32)
Calcium: 9.7 mg/dL (ref 8.4–10.5)
Chloride: 100 mEq/L (ref 96–112)
Creat: 0.91 mg/dL (ref 0.50–1.35)
Glucose, Bld: 132 mg/dL — ABNORMAL HIGH (ref 70–99)
Potassium: 4.1 mEq/L (ref 3.5–5.3)
Sodium: 140 mEq/L (ref 135–145)
TOTAL PROTEIN: 7.5 g/dL (ref 6.0–8.3)
Total Bilirubin: 0.8 mg/dL (ref 0.2–1.2)

## 2014-12-30 ENCOUNTER — Other Ambulatory Visit: Payer: Self-pay | Admitting: Family Medicine

## 2015-01-01 ENCOUNTER — Encounter: Payer: Self-pay | Admitting: Family Medicine

## 2015-01-08 ENCOUNTER — Other Ambulatory Visit: Payer: Self-pay | Admitting: Family Medicine

## 2015-01-17 ENCOUNTER — Ambulatory Visit (INDEPENDENT_AMBULATORY_CARE_PROVIDER_SITE_OTHER): Payer: BLUE CROSS/BLUE SHIELD | Admitting: Family Medicine

## 2015-01-17 ENCOUNTER — Encounter: Payer: Self-pay | Admitting: Family Medicine

## 2015-01-17 VITALS — BP 128/64 | HR 84 | Temp 98.6°F | Resp 18 | Ht 70.0 in | Wt 245.0 lb

## 2015-01-17 DIAGNOSIS — L723 Sebaceous cyst: Secondary | ICD-10-CM

## 2015-01-17 NOTE — Progress Notes (Signed)
Subjective:    Patient ID: James Ponce, male    DOB: 27-Jan-1956, 59 y.o.   MRN: [id number]  HPI Patient is here today due to a painful lesion on his right neck. The patient has a large inflamed red sebaceous cyst. It is approximately 5 cm in diameter. It is tense and painful to the touch and warm. He is requesting incision and drainage. Patient cannot wait for surgical consultation for complete excision of the cyst sac. Past Medical History  Diagnosis Date  . Hypertension   . Hyperlipidemia   . Arthritis   . Prediabetes   . Diverticulosis    No past surgical history on file. Current Outpatient Prescriptions on File Prior to Visit  Medication Sig Dispense Refill  . aspirin EC 81 MG tablet Take 81 mg by mouth daily.    Marland Kitchen azelastine (ASTELIN) 0.1 % nasal spray USE 2 SPRAYS IN EACH NOSTRIL TWICE DAILY 30 mL 6  . azelastine (ASTELIN) 0.1 % nasal spray USE 2 SPRAYS IN EACH NOSTRIL TWICE DAILY 30 mL 6  . celecoxib (CELEBREX) 100 MG capsule TAKE ONE CAPSULE BY MOUTH EVERY DAY 30 capsule 10  . CRESTOR 10 MG tablet TAKE 1 TABLET (10 MG TOTAL) BY MOUTH DAILY. 30 tablet 0  . fish oil-omega-3 fatty acids 1000 MG capsule Take 1-2 g by mouth 2 (two) times daily. 2 capsules in morning, and 1 in evening, daily    . fluticasone (FLONASE) 50 MCG/ACT nasal spray USE 2 SPRAYS EACH NOSTRIL DAILY 16 g 11  . Glucosamine-Chondroitin (GLUCOSAMINE CHONDR COMPLEX PO) Take 1 tablet by mouth 2 (two) times daily.    Marland Kitchen loratadine (CLARITIN) 10 MG tablet Take 1 tablet (10 mg total) by mouth daily. 30 tablet 11  . Multiple Vitamin (MULTIVITAMIN) tablet Take 1 tablet by mouth daily.    . RED YEAST RICE EXTRACT PO Take 500 mg by mouth 2 (two) times daily.     . valsartan-hydrochlorothiazide (DIOVAN-HCT) 160-25 MG per tablet TAKE 1 TABLET BY MOUTH EVERY DAY 30 tablet 11   No current facility-administered medications on file prior to visit.   Allergies  Allergen Reactions  . Lipitor [Atorvastatin] Other (See  Comments)    Muscle cramps, cant walk, sluggish  . Sulfa Antibiotics Itching and Rash   History   Social History  . Marital Status: Single    Spouse Name: N/A  . Number of Children: N/A  . Years of Education: N/A   Occupational History  . Not on file.   Social History Main Topics  . Smoking status: Never Smoker   . Smokeless tobacco: Not on file  . Alcohol Use: No  . Drug Use: No  . Sexual Activity: Not on file   Other Topics Concern  . Not on file   Social History Narrative      Review of Systems  All other systems reviewed and are negative.      Objective:   Physical Exam  Cardiovascular: Normal rate, regular rhythm and normal heart sounds.   Pulmonary/Chest: Effort normal and breath sounds normal.  Skin: There is erythema.  Vitals reviewed.         Assessment & Plan:  Inflamed sebaceous cyst  Lesion was anesthetized with 0.1% lidocaine with epinephrine. A 1 cm vertical incision was made into the cyst sac. Copious foul-smelling material was expressed from the sac. 10 inches of quarter-inch iodoform gauze was then packed in the sac. Wound care was discussed. Recheck in 2 weeks to ensure that  the lesion has healed completely. I would recommend complete excision of the cyst and the cyst sac in its entirety with the general surgical consultation in the future if it returns preferably when its not inflamed

## 2015-02-23 ENCOUNTER — Other Ambulatory Visit: Payer: Self-pay | Admitting: Family Medicine

## 2015-06-24 ENCOUNTER — Other Ambulatory Visit: Payer: Self-pay | Admitting: Family Medicine

## 2015-07-22 ENCOUNTER — Other Ambulatory Visit: Payer: Self-pay | Admitting: *Deleted

## 2015-07-22 MED ORDER — VALSARTAN-HYDROCHLOROTHIAZIDE 160-25 MG PO TABS: 1.0000 | ORAL_TABLET | Freq: Every day | ORAL | Status: DC

## 2015-07-22 NOTE — Telephone Encounter (Signed)
Received fax requesting refill on Diovan HCTZ.  Refill appropriate and filled per protocol. 

## 2015-08-11 ENCOUNTER — Other Ambulatory Visit: Payer: Self-pay | Admitting: Family Medicine

## 2015-10-20 ENCOUNTER — Other Ambulatory Visit: Payer: Self-pay | Admitting: Family Medicine

## 2015-11-03 ENCOUNTER — Other Ambulatory Visit: Payer: Self-pay | Admitting: Family Medicine

## 2016-01-15 ENCOUNTER — Other Ambulatory Visit: Payer: Self-pay | Admitting: Family Medicine

## 2016-01-15 MED ORDER — ROSUVASTATIN CALCIUM 10 MG PO TABS: 10.0000 mg | ORAL_TABLET | Freq: Every day | ORAL | Status: DC

## 2016-01-15 NOTE — Telephone Encounter (Signed)
Medication called/sent to requested pharmacy and pt sent letter to schedule appt and labs

## 2016-01-20 DIAGNOSIS — L570 Actinic keratosis: Secondary | ICD-10-CM | POA: Diagnosis not present

## 2016-01-20 DIAGNOSIS — L080 Pyoderma: Secondary | ICD-10-CM | POA: Diagnosis not present

## 2016-01-20 DIAGNOSIS — D485 Neoplasm of uncertain behavior of skin: Secondary | ICD-10-CM | POA: Diagnosis not present

## 2016-02-13 ENCOUNTER — Encounter: Payer: Self-pay | Admitting: Family Medicine

## 2016-02-13 ENCOUNTER — Ambulatory Visit (INDEPENDENT_AMBULATORY_CARE_PROVIDER_SITE_OTHER): Payer: BLUE CROSS/BLUE SHIELD | Admitting: Family Medicine

## 2016-02-13 VITALS — BP 140/82 | HR 80 | Temp 98.6°F | Resp 16 | Ht 70.0 in | Wt 251.0 lb

## 2016-02-13 DIAGNOSIS — I10 Essential (primary) hypertension: Secondary | ICD-10-CM

## 2016-02-13 DIAGNOSIS — Z125 Encounter for screening for malignant neoplasm of prostate: Secondary | ICD-10-CM | POA: Diagnosis not present

## 2016-02-13 DIAGNOSIS — R7303 Prediabetes: Secondary | ICD-10-CM | POA: Diagnosis not present

## 2016-02-13 DIAGNOSIS — R739 Hyperglycemia, unspecified: Secondary | ICD-10-CM

## 2016-02-13 DIAGNOSIS — R7309 Other abnormal glucose: Secondary | ICD-10-CM | POA: Diagnosis not present

## 2016-02-13 LAB — COMPLETE METABOLIC PANEL WITH GFR
ALT: 53 U/L — ABNORMAL HIGH (ref 9–46)
AST: 35 U/L (ref 10–35)
Albumin: 4.4 g/dL (ref 3.6–5.1)
Alkaline Phosphatase: 93 U/L (ref 40–115)
BILIRUBIN TOTAL: 0.6 mg/dL (ref 0.2–1.2)
BUN: 13 mg/dL (ref 7–25)
CO2: 28 mmol/L (ref 20–31)
Calcium: 9.6 mg/dL (ref 8.6–10.3)
Chloride: 97 mmol/L — ABNORMAL LOW (ref 98–110)
Creat: 0.83 mg/dL (ref 0.70–1.33)
GLUCOSE: 232 mg/dL — AB (ref 70–99)
POTASSIUM: 3.7 mmol/L (ref 3.5–5.3)
SODIUM: 133 mmol/L — AB (ref 135–146)
TOTAL PROTEIN: 6.9 g/dL (ref 6.1–8.1)

## 2016-02-13 LAB — CBC WITH DIFFERENTIAL/PLATELET
BASOS ABS: 56 {cells}/uL (ref 0–200)
Basophils Relative: 1 %
EOS ABS: 224 {cells}/uL (ref 15–500)
EOS PCT: 4 %
HCT: 45 % (ref 38.5–50.0)
Hemoglobin: 15.4 g/dL (ref 13.0–17.0)
LYMPHS PCT: 24 %
Lymphs Abs: 1344 cells/uL (ref 850–3900)
MCH: 30.6 pg (ref 27.0–33.0)
MCHC: 34.2 g/dL (ref 32.0–36.0)
MCV: 89.3 fL (ref 80.0–100.0)
MONOS PCT: 8 %
MPV: 9.6 fL (ref 7.5–12.5)
Monocytes Absolute: 448 cells/uL (ref 200–950)
NEUTROS ABS: 3528 {cells}/uL (ref 1500–7800)
NEUTROS PCT: 63 %
PLATELETS: 218 10*3/uL (ref 140–400)
RBC: 5.04 MIL/uL (ref 4.20–5.80)
RDW: 13.7 % (ref 11.0–15.0)
WBC: 5.6 10*3/uL (ref 3.8–10.8)

## 2016-02-13 LAB — LIPID PANEL
CHOL/HDL RATIO: 2.6 ratio (ref <=5.0)
Cholesterol: 120 mg/dL — ABNORMAL LOW (ref 125–200)
HDL: 47 mg/dL (ref >=40)
LDL CALC: 44 mg/dL (ref <130)
TRIGLYCERIDES: 147 mg/dL (ref <150)
VLDL: 29 mg/dL (ref <30)

## 2016-02-13 LAB — PSA: PSA: 0.4 ng/mL (ref <=4.0)

## 2016-02-13 MED ORDER — VALSARTAN-HYDROCHLOROTHIAZIDE 160-25 MG PO TABS: 1.0000 | ORAL_TABLET | Freq: Every day | ORAL | 1 refills | Status: DC

## 2016-02-13 MED ORDER — HYDROCORTISONE 2.5 % RE CREA: 1.0000 "application " | TOPICAL_CREAM | Freq: Two times a day (BID) | RECTAL | 0 refills | Status: DC

## 2016-02-13 MED ORDER — ROSUVASTATIN CALCIUM 10 MG PO TABS: 10.0000 mg | ORAL_TABLET | Freq: Every day | ORAL | 1 refills | Status: DC

## 2016-02-13 MED ORDER — CELECOXIB 100 MG PO CAPS: 100.0000 mg | ORAL_CAPSULE | Freq: Every day | ORAL | 10 refills | Status: DC

## 2016-02-13 NOTE — Progress Notes (Signed)
Subjective:    Patient ID: James ShieldsMichael Melka, male    DOB: 03/22/1956, 60 y.o.   MRN: [id number]  HPI  6/16 Please see my last office visit. Patient has a history of prediabetes. However his most recent hemoglobin A1c returned 15.8. Therefore I had the patient come in today urgently for recheck of his hemoglobin A1c and to discuss insulin. Thankfully, the patient's hemoglobin A1c is only 6.7. Therefore the lab work was obviously an error. I will have the patient repeat all of his lab work today. I have also asked the lab to wave the fever his blood work as it is obvious that his previous blood work was incorrect. Therefore the patient does not need insulin.  At that time, my plan was: Patient's previous blood test is erroneous. Hemoglobin A1c is acceptable at 6.7. We did discuss diet exercise and weight loss to help manage his diabetes. I would recheck it in 6 months. However the patient has no indication for insulin at this time.  8/101/17 Here today at our request to follow-up on his blood pressure and his diabetes. His blood pressure is borderline today. He denies any chest pain shortness of breath or dyspnea on exertion. He is not checking his sugars at all. He denies any polyuria, polydipsia, or blurred vision. He denies any chest pain shortness of breath or dyspnea on exertion. He denies any myalgias or right upper quadrant pain. He is due for prostate cancer screening as well Past Medical History:  Diagnosis Date  . Arthritis   . Diverticulosis   . Hyperlipidemia   . Hypertension   . Prediabetes    No past surgical history on file. Current Outpatient Prescriptions on File Prior to Visit  Medication Sig Dispense Refill  . aspirin EC 81 MG tablet Take 81 mg by mouth daily.    Marland Kitchen. azelastine (ASTELIN) 0.1 % nasal spray USE 2 SPRAYS IN EACH NOSTRIL TWICE DAILY 30 mL 6  . celecoxib (CELEBREX) 100 MG capsule TAKE ONE CAPSULE BY MOUTH EVERY DAY 30 capsule 10  . fish oil-omega-3 fatty acids 1000  MG capsule Take 1-2 g by mouth 2 (two) times daily. 2 capsules in morning, and 1 in evening, daily    . fluticasone (FLONASE) 50 MCG/ACT nasal spray USE 2 SPRAYS EACH NOSTRIL DAILY 16 g 11  . Glucosamine-Chondroitin (GLUCOSAMINE CHONDR COMPLEX PO) Take 1 tablet by mouth 2 (two) times daily.    Marland Kitchen. loratadine (CLARITIN) 10 MG tablet Take 1 tablet (10 mg total) by mouth daily. 30 tablet 11  . Multiple Vitamin (MULTIVITAMIN) tablet Take 1 tablet by mouth daily.    . RED YEAST RICE EXTRACT PO Take 500 mg by mouth 2 (two) times daily.     . rosuvastatin (CRESTOR) 10 MG tablet Take 1 tablet (10 mg total) by mouth daily. 90 tablet 0  . valsartan-hydrochlorothiazide (DIOVAN-HCT) 160-25 MG tablet Take 1 tablet by mouth daily. 90 tablet 1   No current facility-administered medications on file prior to visit.    Allergies  Allergen Reactions  . Lipitor [Atorvastatin] Other (See Comments)    Muscle cramps, cant walk, sluggish  . Sulfa Antibiotics Itching and Rash   Social History   Social History  . Marital status: Single    Spouse name: N/A  . Number of children: N/A  . Years of education: N/A   Occupational History  . Not on file.   Social History Main Topics  . Smoking status: Never Smoker  . Smokeless tobacco: Not  on file  . Alcohol use No  . Drug use: No  . Sexual activity: Not on file   Other Topics Concern  . Not on file   Social History Narrative  . No narrative on file      Review of Systems  All other systems reviewed and are negative.      Objective:   Physical Exam  Neck: Neck supple. No JVD present.  Cardiovascular: Normal rate, regular rhythm, normal heart sounds and intact distal pulses.  Exam reveals no gallop and no friction rub.   No murmur heard. Pulmonary/Chest: Effort normal and breath sounds normal. No respiratory distress. He has no wheezes. He has no rales.  Abdominal: Soft. Bowel sounds are normal. He exhibits no distension. There is no tenderness.  There is no rebound and no guarding.  Musculoskeletal: He exhibits no edema.  Vitals reviewed.         Assessment & Plan:  Elevated blood sugar  Prediabetes - Plan: CBC with Differential/Platelet, COMPLETE METABOLIC PANEL WITH GFR, Lipid panel, Hemoglobin A1c  Benign essential HTN  His blood pressures borderline. I recommended that he check it daily at home and notify me of the values. I will check a hemoglobin A1c. Goal hemoglobin A1c is less than 6.5. I encouraged a low carbohydrate diet. I will also check a fasting lipid panel. Goal LDL cholesterol is less than 100. He is taking an aspirin. I recommended a diabetic eye exam. Diabetic foot exam is up-to-date. Colonoscopy is up-to-date.

## 2016-02-13 NOTE — Addendum Note (Signed)
Addended by: Legrand RamsWILLIS, Ashley Montminy B on: 02/13/2016 02:53 PM   Modules accepted: Orders

## 2016-02-14 ENCOUNTER — Encounter: Payer: Self-pay | Admitting: Family Medicine

## 2016-02-14 DIAGNOSIS — E1165 Type 2 diabetes mellitus with hyperglycemia: Secondary | ICD-10-CM | POA: Insufficient documentation

## 2016-02-14 DIAGNOSIS — IMO0002 Reserved for concepts with insufficient information to code with codable children: Secondary | ICD-10-CM | POA: Insufficient documentation

## 2016-02-14 LAB — HEMOGLOBIN A1C
HEMOGLOBIN A1C: 8.9 % — AB (ref <5.7)
Mean Plasma Glucose: 209 mg/dL

## 2016-02-21 ENCOUNTER — Other Ambulatory Visit: Payer: Self-pay

## 2016-02-21 MED ORDER — METFORMIN HCL 500 MG PO TABS: 500.0000 mg | ORAL_TABLET | Freq: Two times a day (BID) | ORAL | 3 refills | Status: DC

## 2016-04-03 ENCOUNTER — Other Ambulatory Visit: Payer: Self-pay | Admitting: Family Medicine

## 2016-04-09 ENCOUNTER — Telehealth: Payer: Self-pay | Admitting: Family Medicine

## 2016-04-09 NOTE — Telephone Encounter (Signed)
Patient has a question about his metformin and the dosage please call him back at [phone]

## 2016-04-10 MED ORDER — METFORMIN HCL 1000 MG PO TABS: 1000.0000 mg | ORAL_TABLET | Freq: Two times a day (BID) | ORAL | 3 refills | Status: DC

## 2016-04-10 NOTE — Telephone Encounter (Signed)
Pt was almost out of metformin as he has increased it to 2 tab BID and is doing fine on it. Sent in RX for Metformin 1000 mg BID and pt aware

## 2016-06-17 ENCOUNTER — Ambulatory Visit (INDEPENDENT_AMBULATORY_CARE_PROVIDER_SITE_OTHER): Payer: BLUE CROSS/BLUE SHIELD | Admitting: Family Medicine

## 2016-06-17 DIAGNOSIS — Z23 Encounter for immunization: Secondary | ICD-10-CM | POA: Diagnosis not present

## 2016-06-22 DIAGNOSIS — D225 Melanocytic nevi of trunk: Secondary | ICD-10-CM | POA: Diagnosis not present

## 2016-06-22 DIAGNOSIS — Z85828 Personal history of other malignant neoplasm of skin: Secondary | ICD-10-CM | POA: Diagnosis not present

## 2016-06-22 DIAGNOSIS — L821 Other seborrheic keratosis: Secondary | ICD-10-CM | POA: Diagnosis not present

## 2016-06-22 DIAGNOSIS — D485 Neoplasm of uncertain behavior of skin: Secondary | ICD-10-CM | POA: Diagnosis not present

## 2016-06-22 DIAGNOSIS — L570 Actinic keratosis: Secondary | ICD-10-CM | POA: Diagnosis not present

## 2016-07-23 LAB — HM DIABETES EYE EXAM

## 2016-07-30 DIAGNOSIS — L400 Psoriasis vulgaris: Secondary | ICD-10-CM | POA: Diagnosis not present

## 2016-10-05 DIAGNOSIS — D485 Neoplasm of uncertain behavior of skin: Secondary | ICD-10-CM | POA: Diagnosis not present

## 2016-11-17 ENCOUNTER — Other Ambulatory Visit: Payer: Self-pay | Admitting: Family Medicine

## 2016-12-09 DIAGNOSIS — C44619 Basal cell carcinoma of skin of left upper limb, including shoulder: Secondary | ICD-10-CM | POA: Diagnosis not present

## 2016-12-24 ENCOUNTER — Other Ambulatory Visit: Payer: Self-pay | Admitting: Family Medicine

## 2017-01-10 ENCOUNTER — Other Ambulatory Visit: Payer: Self-pay | Admitting: Family Medicine

## 2017-02-14 ENCOUNTER — Other Ambulatory Visit: Payer: Self-pay | Admitting: Family Medicine

## 2017-03-07 ENCOUNTER — Other Ambulatory Visit: Payer: Self-pay | Admitting: Family Medicine

## 2017-03-14 DIAGNOSIS — L255 Unspecified contact dermatitis due to plants, except food: Secondary | ICD-10-CM | POA: Diagnosis not present

## 2017-03-14 DIAGNOSIS — R739 Hyperglycemia, unspecified: Secondary | ICD-10-CM | POA: Diagnosis not present

## 2017-03-19 ENCOUNTER — Encounter: Payer: Self-pay | Admitting: Family Medicine

## 2017-03-19 ENCOUNTER — Ambulatory Visit (INDEPENDENT_AMBULATORY_CARE_PROVIDER_SITE_OTHER): Payer: BLUE CROSS/BLUE SHIELD | Admitting: Family Medicine

## 2017-03-19 VITALS — BP 122/84 | HR 68 | Temp 98.0°F | Resp 18 | Wt 233.0 lb

## 2017-03-19 DIAGNOSIS — Z125 Encounter for screening for malignant neoplasm of prostate: Secondary | ICD-10-CM | POA: Diagnosis not present

## 2017-03-19 DIAGNOSIS — E119 Type 2 diabetes mellitus without complications: Secondary | ICD-10-CM | POA: Diagnosis not present

## 2017-03-19 DIAGNOSIS — B029 Zoster without complications: Secondary | ICD-10-CM | POA: Diagnosis not present

## 2017-03-19 MED ORDER — VALACYCLOVIR HCL 1 G PO TABS: 1000.0000 mg | ORAL_TABLET | Freq: Three times a day (TID) | ORAL | 0 refills | Status: DC

## 2017-03-19 NOTE — Progress Notes (Signed)
Subjective:    Patient ID: James Ponce, male    DOB: [birth year], 61 y.o.   MRN: [id number]  Medication Refill     6/16 Please see my last office visit. Patient has a history of prediabetes. However his most recent hemoglobin A1c returned 15.8. Therefore I had the patient come in today urgently for recheck of his hemoglobin A1c and to discuss insulin. Thankfully, the patient's hemoglobin A1c is only 6.7. Therefore the lab work was obviously an error. I will have the patient repeat all of his lab work today. I have also asked the lab to wave the fever his blood work as it is obvious that his previous blood work was incorrect. Therefore the patient does not need insulin.  At that time, my plan was: Patient's previous blood test is erroneous. Hemoglobin A1c is acceptable at 6.7. We did discuss diet exercise and weight loss to help manage his diabetes. I would recheck it in 6 months. However the patient has no indication for insulin at this time.  8/101/17 Here today at our request to follow-up on his blood pressure and his diabetes. His blood pressure is borderline today. He denies any chest pain shortness of breath or dyspnea on exertion. He is not checking his sugars at all. He denies any polyuria, polydipsia, or blurred vision. He denies any chest pain shortness of breath or dyspnea on exertion. He denies any myalgias or right upper quadrant pain. He is due for prostate cancer screening as well.  At that time, my plan was: His blood pressures borderline. I recommended that he check it daily at home and notify me of the values. I will check a hemoglobin A1c. Goal hemoglobin A1c is less than 6.5. I encouraged a low carbohydrate diet. I will also check a fasting lipid panel. Goal LDL cholesterol is less than 100. He is taking an aspirin. I recommended a diabetic eye exam. Diabetic foot exam is up-to-date. Colonoscopy is up-to-date.  03/19/17 "I've got poison oak."  Patient states that he was cutting  vines with a chainsaw last week. Some of the fluid sprayed on his face his neck his upper chest. He went to an urgent care Sunday where he was given Depo-Medrol as well as a prednisone taper pack. I do not have the doses but the patient states he took 5 pills the first day and then for an and so on. He was given enough prednisone to last 9 days. The rash is no better. The rash is limited just to the right side of his face. It spreads onto his scalp which was covered with a ball. Is on the right side of his neck. He does know what else on his body. There is no rash on the left side of his face on his arms on his hand on his chest or anywhere else. Patient states that he has not had please note in 50 years. He states that it never itched. It burns and hurts terribly. He denies any vision changes. Rash consists of numerous vesicles and papules that have coalesced into plaques. There are ulcers and sores and scabs on his neck and under his right mandible. The rash appears to be more herpetiform in nature with numerous tiny vesicles. I believe he has shingles based on his appearance and the fact that has not responded to prednisone and it is limited unilaterally to the right side of his face with no spread on either exposed skin surfaces. He is doubtful of this diagnosis  but is taking prednisone at the present time. Of note, at his last visit, he was a prediabetic. He states that his sugar on Saturday at the urgent care was over 300 Past Medical History:  Diagnosis Date  . Arthritis   . Diabetes mellitus type II, uncontrolled (HCC)   . Diverticulosis   . Hyperlipidemia   . Hypertension    No past surgical history on file. Current Outpatient Prescriptions on File Prior to Visit  Medication Sig Dispense Refill  . aspirin EC 81 MG tablet Take 81 mg by mouth daily.    Marland Kitchen azelastine (ASTELIN) 0.1 % nasal spray USE 2 SPRAYS IN EACH NOSTRIL TWICE DAILY 30 mL 6  . azelastine (ASTELIN) 0.1 % nasal spray USE 2 SPRAYS  IN EACH NOSTRIL TWICE DAILY 90 mL 6  . celecoxib (CELEBREX) 100 MG capsule TAKE 1 CAPSULE (100 MG TOTAL) BY MOUTH DAILY. 30 capsule 4  . fish oil-omega-3 fatty acids 1000 MG capsule Take 1-2 g by mouth 2 (two) times daily. 2 capsules in morning, and 1 in evening, daily    . fluticasone (FLONASE) 50 MCG/ACT nasal spray USE 2 SPRAYS EACH NOSTRIL DAILY 16 g 11  . fluticasone (FLONASE) 50 MCG/ACT nasal spray USE 2 SPRAYS EACH NOSTRIL DAILY 48 g 2  . Glucosamine-Chondroitin (GLUCOSAMINE CHONDR COMPLEX PO) Take 1 tablet by mouth 2 (two) times daily.    . hydrocortisone (ANUSOL-HC) 2.5 % rectal cream Place 1 application rectally 2 (two) times daily. 30 g 0  . loratadine (CLARITIN) 10 MG tablet Take 1 tablet (10 mg total) by mouth daily. 30 tablet 11  . metFORMIN (GLUCOPHAGE) 1000 MG tablet Take 1 tablet (1,000 mg total) by mouth 2 (two) times daily with a meal. 180 tablet 3  . Multiple Vitamin (MULTIVITAMIN) tablet Take 1 tablet by mouth daily.    . RED YEAST RICE EXTRACT PO Take 500 mg by mouth 2 (two) times daily.     . rosuvastatin (CRESTOR) 10 MG tablet TAKE 1 TABLET BY MOUTH EVERY DAY 90 tablet 0  . valsartan-hydrochlorothiazide (DIOVAN-HCT) 160-25 MG tablet TAKE 1 TABLET BY MOUTH DAILY. 90 tablet 1   No current facility-administered medications on file prior to visit.    Allergies  Allergen Reactions  . Lipitor [Atorvastatin] Other (See Comments)    Muscle cramps, cant walk, sluggish  . Sulfa Antibiotics Itching and Rash   Social History   Social History  . Marital status: Single    Spouse name: N/A  . Number of children: N/A  . Years of education: N/A   Occupational History  . Not on file.   Social History Main Topics  . Smoking status: Never Smoker  . Smokeless tobacco: Not on file  . Alcohol use No  . Drug use: No  . Sexual activity: Not on file   Other Topics Concern  . Not on file   Social History Narrative  . No narrative on file      Review of Systems  All  other systems reviewed and are negative.      Objective:   Physical Exam  HENT:  Head:    Neck: Neck supple.  Cardiovascular: Normal rate, regular rhythm, normal heart sounds and intact distal pulses.  Exam reveals no gallop and no friction rub.   No murmur heard. Pulmonary/Chest: Effort normal and breath sounds normal. No respiratory distress. He has no wheezes. He has no rales.  Abdominal: Soft. Bowel sounds are normal.  Musculoskeletal: He exhibits no edema.  Lymphadenopathy:    He has no cervical adenopathy.  Skin: Rash noted. There is erythema.  Vitals reviewed.         Assessment & Plan:  Herpes zoster without complication - Plan: valACYclovir (VALTREX) 1000 MG tablet  Diabetes mellitus without complication (HCC) - Plan: COMPLETE METABOLIC PANEL WITH GFR, Hemoglobin A1c, Lipid panel  Prostate cancer screening - Plan: PSA  It is certainly possible this is a contact dermatitis however it is extremely severe and also extremely well demarcated and unilateral. It also is more vesicular in appearance with ulcers and scabbing and neuropathic pain. Therefore I believe he has shingles rather than contact dermatitis. I recommended adding Valtrex 1 g by mouth 3 times a day for 7 days. He is already on prednisone and therefore if it were a contact dermatitis, I do not believe there is anything more I can add at this point, especially if his sugars are over 300. Concerning that, I recommended checking hemoglobin A1c as well as a fasting lipid panel and a CMP. If his hemoglobin A1c is greater than 6.5, we will need to discuss ways to manage this. While drawing lab work, the patient like to be screened for prostate cancer. Patient seems a little frustrated with my diagnosis. He is convinced this is poison oak.

## 2017-03-20 LAB — COMPLETE METABOLIC PANEL WITH GFR
AG RATIO: 1.7 (calc) (ref 1.0–2.5)
ALBUMIN MSPROF: 4.5 g/dL (ref 3.6–5.1)
ALT: 49 U/L — ABNORMAL HIGH (ref 9–46)
AST: 22 U/L (ref 10–35)
Alkaline phosphatase (APISO): 101 U/L (ref 40–115)
BILIRUBIN TOTAL: 0.7 mg/dL (ref 0.2–1.2)
BUN: 18 mg/dL (ref 7–25)
CHLORIDE: 98 mmol/L (ref 98–110)
CO2: 30 mmol/L (ref 20–32)
Calcium: 10.5 mg/dL — ABNORMAL HIGH (ref 8.6–10.3)
Creat: 0.95 mg/dL (ref 0.70–1.25)
GFR, Est African American: 100 mL/min/{1.73_m2} (ref >=60)
GFR, Est Non African American: 87 mL/min/{1.73_m2} (ref >=60)
GLOBULIN: 2.7 g/dL (ref 1.9–3.7)
Glucose, Bld: 294 mg/dL — ABNORMAL HIGH (ref 65–99)
POTASSIUM: 5.1 mmol/L (ref 3.5–5.3)
Sodium: 135 mmol/L (ref 135–146)
Total Protein: 7.2 g/dL (ref 6.1–8.1)

## 2017-03-20 LAB — LIPID PANEL
CHOLESTEROL: 118 mg/dL (ref <200)
HDL: 40 mg/dL — ABNORMAL LOW (ref >40)
LDL Cholesterol (Calc): 54 mg/dL (calc)
Non-HDL Cholesterol (Calc): 78 mg/dL (calc) (ref <130)
Total CHOL/HDL Ratio: 3 (calc) (ref <5.0)
Triglycerides: 162 mg/dL — ABNORMAL HIGH (ref <150)

## 2017-03-20 LAB — HEMOGLOBIN A1C
Hgb A1c MFr Bld: 11.1 % of total Hgb — ABNORMAL HIGH (ref <5.7)
MEAN PLASMA GLUCOSE: 272 (calc)
eAG (mmol/L): 15.1 (calc)

## 2017-03-20 LAB — PSA: PSA: 0.3 ng/mL (ref <=4.0)

## 2017-03-25 ENCOUNTER — Other Ambulatory Visit: Payer: Self-pay | Admitting: Family Medicine

## 2017-03-31 ENCOUNTER — Other Ambulatory Visit: Payer: Self-pay | Admitting: *Deleted

## 2017-03-31 ENCOUNTER — Encounter: Payer: Self-pay | Admitting: *Deleted

## 2017-03-31 MED ORDER — EMPAGLIFLOZIN 25 MG PO TABS: 25.0000 mg | ORAL_TABLET | Freq: Every day | ORAL | 3 refills | Status: DC

## 2017-03-31 MED ORDER — GLIPIZIDE ER 10 MG PO TB24: 10.0000 mg | ORAL_TABLET | Freq: Every day | ORAL | 3 refills | Status: DC

## 2017-04-04 ENCOUNTER — Encounter: Payer: Self-pay | Admitting: Family Medicine

## 2017-04-05 ENCOUNTER — Encounter: Payer: Self-pay | Admitting: Family Medicine

## 2017-04-05 ENCOUNTER — Telehealth: Payer: Self-pay

## 2017-04-05 MED ORDER — GLUCOSE BLOOD VI STRP: ORAL_STRIP | 12 refills | Status: DC

## 2017-04-05 NOTE — Telephone Encounter (Signed)
Patient sent a email requesting Contour test strips to go with his meter. Rx sent to pharmacy patient aware via My Chart

## 2017-04-07 ENCOUNTER — Other Ambulatory Visit: Payer: Self-pay

## 2017-04-07 MED ORDER — SITAGLIPTIN PHOSPHATE 100 MG PO TABS: 100.0000 mg | ORAL_TABLET | Freq: Every day | ORAL | 1 refills | Status: DC

## 2017-04-07 NOTE — Telephone Encounter (Signed)
Fax from pharmacy  James Ponce will cost the patient 1300.00. Patient is requesting cheaper alternative Januvia was sent in today

## 2017-04-23 ENCOUNTER — Encounter: Payer: Self-pay | Admitting: Family Medicine

## 2017-04-23 ENCOUNTER — Ambulatory Visit (INDEPENDENT_AMBULATORY_CARE_PROVIDER_SITE_OTHER): Payer: BLUE CROSS/BLUE SHIELD | Admitting: Family Medicine

## 2017-04-23 VITALS — BP 132/68 | HR 88 | Temp 97.8°F | Resp 16 | Ht 70.0 in | Wt 240.0 lb

## 2017-04-23 DIAGNOSIS — L089 Local infection of the skin and subcutaneous tissue, unspecified: Secondary | ICD-10-CM

## 2017-04-23 DIAGNOSIS — L723 Sebaceous cyst: Secondary | ICD-10-CM | POA: Diagnosis not present

## 2017-04-23 MED ORDER — DOXYCYCLINE HYCLATE 100 MG PO TABS: 100.0000 mg | ORAL_TABLET | Freq: Two times a day (BID) | ORAL | 0 refills | Status: DC

## 2017-04-23 NOTE — Progress Notes (Signed)
Subjective:    Patient ID: James Ponce, male    DOB: [birth year], 61 y.o.   MRN: [id number]  HPI  Patient has a large infected sebaceous cyst on his posterior neck on the right side. It is approximately 4 cm in diameter on the outside. There is erythema spreading towards his ear and into his neck on the right side. It has been there for months but it has doubled in size in the last 2 days. It is extremely painful. Past Medical History:  Diagnosis Date  . Arthritis   . Diabetes mellitus type II, uncontrolled (HCC)   . Diverticulosis   . Hyperlipidemia   . Hypertension    No past surgical history on file. Current Outpatient Prescriptions on File Prior to Visit  Medication Sig Dispense Refill  . aspirin EC 81 MG tablet Take 81 mg by mouth daily.    Marland Kitchen azelastine (ASTELIN) 0.1 % nasal spray USE 2 SPRAYS IN EACH NOSTRIL TWICE DAILY 30 mL 6  . celecoxib (CELEBREX) 100 MG capsule TAKE 1 CAPSULE (100 MG TOTAL) BY MOUTH DAILY. 30 capsule 4  . fish oil-omega-3 fatty acids 1000 MG capsule Take 1-2 g by mouth 2 (two) times daily. 2 capsules in morning, and 1 in evening, daily    . fluticasone (FLONASE) 50 MCG/ACT nasal spray USE 2 SPRAYS EACH NOSTRIL DAILY 48 g 2  . glipiZIDE (GLUCOTROL XL) 10 MG 24 hr tablet Take 1 tablet (10 mg total) by mouth daily with breakfast. 90 tablet 3  . Glucosamine-Chondroitin (GLUCOSAMINE CHONDR COMPLEX PO) Take 1 tablet by mouth 2 (two) times daily.    Marland Kitchen glucose blood test strip Use to check blood sugar twice daily.*Dispense Contour next test strips* 100 each 12  . hydrocortisone (ANUSOL-HC) 2.5 % rectal cream Place 1 application rectally 2 (two) times daily. 30 g 0  . loratadine (CLARITIN) 10 MG tablet Take 1 tablet (10 mg total) by mouth daily. 30 tablet 11  . metFORMIN (GLUCOPHAGE) 1000 MG tablet TAKE 1 TABLET (1,000 MG TOTAL) BY MOUTH 2 (TWO) TIMES DAILY WITH A MEAL. 180 tablet 3  . Multiple Vitamin (MULTIVITAMIN) tablet Take 1 tablet by mouth daily.    . RED  YEAST RICE EXTRACT PO Take 500 mg by mouth 2 (two) times daily.     . rosuvastatin (CRESTOR) 10 MG tablet TAKE 1 TABLET BY MOUTH EVERY DAY 90 tablet 0  . sitaGLIPtin (JANUVIA) 100 MG tablet Take 1 tablet (100 mg total) by mouth daily. 90 tablet 1  . valsartan-hydrochlorothiazide (DIOVAN-HCT) 160-25 MG tablet TAKE 1 TABLET BY MOUTH DAILY. 90 tablet 1   No current facility-administered medications on file prior to visit.    Allergies  Allergen Reactions  . Lipitor [Atorvastatin] Other (See Comments)    Muscle cramps, cant walk, sluggish  . Sulfa Antibiotics Itching and Rash   Social History   Social History  . Marital status: Single    Spouse name: N/A  . Number of children: N/A  . Years of education: N/A   Occupational History  . Not on file.   Social History Main Topics  . Smoking status: Never Smoker  . Smokeless tobacco: Current User    Types: Chew  . Alcohol use No  . Drug use: No  . Sexual activity: Not on file   Other Topics Concern  . Not on file   Social History Narrative  . No narrative on file   Current Outpatient Prescriptions on File Prior to Visit  Medication  Sig Dispense Refill  . aspirin EC 81 MG tablet Take 81 mg by mouth daily.    Marland Kitchen. azelastine (ASTELIN) 0.1 % nasal spray USE 2 SPRAYS IN EACH NOSTRIL TWICE DAILY 30 mL 6  . celecoxib (CELEBREX) 100 MG capsule TAKE 1 CAPSULE (100 MG TOTAL) BY MOUTH DAILY. 30 capsule 4  . fish oil-omega-3 fatty acids 1000 MG capsule Take 1-2 g by mouth 2 (two) times daily. 2 capsules in morning, and 1 in evening, daily    . fluticasone (FLONASE) 50 MCG/ACT nasal spray USE 2 SPRAYS EACH NOSTRIL DAILY 48 g 2  . glipiZIDE (GLUCOTROL XL) 10 MG 24 hr tablet Take 1 tablet (10 mg total) by mouth daily with breakfast. 90 tablet 3  . Glucosamine-Chondroitin (GLUCOSAMINE CHONDR COMPLEX PO) Take 1 tablet by mouth 2 (two) times daily.    Marland Kitchen. glucose blood test strip Use to check blood sugar twice daily.*Dispense Contour next test strips*  100 each 12  . hydrocortisone (ANUSOL-HC) 2.5 % rectal cream Place 1 application rectally 2 (two) times daily. 30 g 0  . loratadine (CLARITIN) 10 MG tablet Take 1 tablet (10 mg total) by mouth daily. 30 tablet 11  . metFORMIN (GLUCOPHAGE) 1000 MG tablet TAKE 1 TABLET (1,000 MG TOTAL) BY MOUTH 2 (TWO) TIMES DAILY WITH A MEAL. 180 tablet 3  . Multiple Vitamin (MULTIVITAMIN) tablet Take 1 tablet by mouth daily.    . RED YEAST RICE EXTRACT PO Take 500 mg by mouth 2 (two) times daily.     . rosuvastatin (CRESTOR) 10 MG tablet TAKE 1 TABLET BY MOUTH EVERY DAY 90 tablet 0  . sitaGLIPtin (JANUVIA) 100 MG tablet Take 1 tablet (100 mg total) by mouth daily. 90 tablet 1  . valsartan-hydrochlorothiazide (DIOVAN-HCT) 160-25 MG tablet TAKE 1 TABLET BY MOUTH DAILY. 90 tablet 1   No current facility-administered medications on file prior to visit.      Review of Systems  All other systems reviewed and are negative.      Objective:   Physical Exam  Neck:    Cardiovascular: Normal rate, regular rhythm and normal heart sounds.   Pulmonary/Chest: Effort normal and breath sounds normal. No respiratory distress. He has no wheezes. He has no rales.  Skin: There is erythema.  Vitals reviewed.         Assessment & Plan:  Infected sebaceous cyst of skin - Plan: doxycycline (VIBRA-TABS) 100 MG tablet  Area was anesthetized with 0.1% lidocaine with epinephrine. A 1 cm vertical incision was made in the center of the infected cyst. Copious purulent material was expressed. Hemostats were introduced into the opening and the wound was probed to a depth of approximately 2 inches towards the midline of his neck. A second pocket of pus was then found. Hemostats were opened releasing more pus.The wound was then probed with Q-tips soaked in hydrogen peroxide and thoroughly cleaned. It was then packed with over 10 inches of one quarter-inch iodoform gauze. Begin doxycycline 100 mg by mouth twice a day for 10 days.  Wound culture was sent. Follow up immediately if getting worse or go to ER.

## 2017-04-23 NOTE — Addendum Note (Signed)
Addended by: Legrand RamsWILLIS, SANDY B on: 04/23/2017 03:52 PM   Modules accepted: Orders

## 2017-04-26 LAB — WOUND CULTURE
MICRO NUMBER:: 81172373
RESULT: NO GROWTH
SPECIMEN QUALITY: ADEQUATE

## 2017-05-05 ENCOUNTER — Other Ambulatory Visit: Payer: Self-pay | Admitting: Family Medicine

## 2017-05-23 ENCOUNTER — Other Ambulatory Visit: Payer: Self-pay | Admitting: Family Medicine

## 2017-05-24 NOTE — Telephone Encounter (Signed)
Medication refilled per protocol. 

## 2017-06-16 DIAGNOSIS — D2362 Other benign neoplasm of skin of left upper limb, including shoulder: Secondary | ICD-10-CM | POA: Diagnosis not present

## 2017-06-16 DIAGNOSIS — L110 Acquired keratosis follicularis: Secondary | ICD-10-CM | POA: Diagnosis not present

## 2017-06-16 DIAGNOSIS — D485 Neoplasm of uncertain behavior of skin: Secondary | ICD-10-CM | POA: Diagnosis not present

## 2017-06-16 DIAGNOSIS — D225 Melanocytic nevi of trunk: Secondary | ICD-10-CM | POA: Diagnosis not present

## 2017-06-16 DIAGNOSIS — Z85828 Personal history of other malignant neoplasm of skin: Secondary | ICD-10-CM | POA: Diagnosis not present

## 2017-06-16 DIAGNOSIS — L821 Other seborrheic keratosis: Secondary | ICD-10-CM | POA: Diagnosis not present

## 2017-06-16 DIAGNOSIS — Z86018 Personal history of other benign neoplasm: Secondary | ICD-10-CM | POA: Diagnosis not present

## 2017-06-16 DIAGNOSIS — L570 Actinic keratosis: Secondary | ICD-10-CM | POA: Diagnosis not present

## 2017-06-25 ENCOUNTER — Ambulatory Visit (INDEPENDENT_AMBULATORY_CARE_PROVIDER_SITE_OTHER): Payer: BLUE CROSS/BLUE SHIELD

## 2017-06-25 DIAGNOSIS — Z23 Encounter for immunization: Secondary | ICD-10-CM

## 2017-06-25 NOTE — Progress Notes (Signed)
Patient was in office to receive flu vaccine. Patient received vaccine in his left deltoid. Patient tolerated well.

## 2017-07-03 ENCOUNTER — Other Ambulatory Visit: Payer: Self-pay | Admitting: Family Medicine

## 2017-07-14 ENCOUNTER — Encounter: Payer: Self-pay | Admitting: Family Medicine

## 2017-07-22 ENCOUNTER — Ambulatory Visit (INDEPENDENT_AMBULATORY_CARE_PROVIDER_SITE_OTHER): Payer: BLUE CROSS/BLUE SHIELD | Admitting: *Deleted

## 2017-07-22 ENCOUNTER — Other Ambulatory Visit: Payer: Self-pay

## 2017-07-22 DIAGNOSIS — Z23 Encounter for immunization: Secondary | ICD-10-CM | POA: Diagnosis not present

## 2017-07-22 NOTE — Progress Notes (Signed)
Patient seen in office for Shingles Vaccination.   Tolerated IM administration well.   Immunization history updated.  

## 2017-07-23 DIAGNOSIS — E119 Type 2 diabetes mellitus without complications: Secondary | ICD-10-CM | POA: Diagnosis not present

## 2017-07-30 ENCOUNTER — Other Ambulatory Visit: Payer: Self-pay | Admitting: Family Medicine

## 2017-08-11 ENCOUNTER — Other Ambulatory Visit: Payer: Self-pay | Admitting: Family Medicine

## 2017-09-02 ENCOUNTER — Encounter: Payer: Self-pay | Admitting: *Deleted

## 2017-10-03 ENCOUNTER — Other Ambulatory Visit: Payer: Self-pay | Admitting: Family Medicine

## 2017-10-20 ENCOUNTER — Ambulatory Visit (INDEPENDENT_AMBULATORY_CARE_PROVIDER_SITE_OTHER): Payer: BLUE CROSS/BLUE SHIELD

## 2017-10-20 DIAGNOSIS — Z23 Encounter for immunization: Secondary | ICD-10-CM | POA: Diagnosis not present

## 2017-10-25 DIAGNOSIS — E119 Type 2 diabetes mellitus without complications: Secondary | ICD-10-CM | POA: Diagnosis not present

## 2017-10-25 DIAGNOSIS — Z1211 Encounter for screening for malignant neoplasm of colon: Secondary | ICD-10-CM | POA: Diagnosis not present

## 2017-10-25 DIAGNOSIS — K625 Hemorrhage of anus and rectum: Secondary | ICD-10-CM | POA: Diagnosis not present

## 2017-11-09 ENCOUNTER — Other Ambulatory Visit: Payer: Self-pay | Admitting: Family Medicine

## 2017-11-19 ENCOUNTER — Other Ambulatory Visit: Payer: Self-pay | Admitting: Family Medicine

## 2017-11-19 NOTE — Telephone Encounter (Signed)
Needs office visit and labs before further refills  

## 2017-11-23 ENCOUNTER — Other Ambulatory Visit: Payer: Self-pay | Admitting: Family Medicine

## 2017-12-21 DIAGNOSIS — D125 Benign neoplasm of sigmoid colon: Secondary | ICD-10-CM | POA: Diagnosis not present

## 2017-12-21 DIAGNOSIS — K5730 Diverticulosis of large intestine without perforation or abscess without bleeding: Secondary | ICD-10-CM | POA: Diagnosis not present

## 2017-12-21 DIAGNOSIS — Z1211 Encounter for screening for malignant neoplasm of colon: Secondary | ICD-10-CM | POA: Diagnosis not present

## 2017-12-21 DIAGNOSIS — K635 Polyp of colon: Secondary | ICD-10-CM | POA: Diagnosis not present

## 2017-12-26 ENCOUNTER — Other Ambulatory Visit: Payer: Self-pay | Admitting: Family Medicine

## 2017-12-29 ENCOUNTER — Other Ambulatory Visit: Payer: Self-pay | Admitting: Family Medicine

## 2018-02-24 ENCOUNTER — Other Ambulatory Visit: Payer: Self-pay | Admitting: Family Medicine

## 2018-02-24 MED ORDER — METFORMIN HCL 1000 MG PO TABS: 1000.0000 mg | ORAL_TABLET | Freq: Two times a day (BID) | ORAL | 3 refills | Status: DC

## 2018-02-24 NOTE — Addendum Note (Signed)
Addended by: WILLIS, SANDY B on: 02/24/2018 09:22 AM   Modules accepted: Orders  

## 2018-03-31 ENCOUNTER — Other Ambulatory Visit: Payer: Self-pay | Admitting: Family Medicine

## 2018-04-10 ENCOUNTER — Other Ambulatory Visit: Payer: Self-pay | Admitting: Family Medicine

## 2018-04-20 ENCOUNTER — Other Ambulatory Visit: Payer: Self-pay | Admitting: Family Medicine

## 2018-05-15 ENCOUNTER — Other Ambulatory Visit: Payer: Self-pay | Admitting: Family Medicine

## 2018-07-04 ENCOUNTER — Other Ambulatory Visit: Payer: Self-pay | Admitting: Family Medicine

## 2018-07-23 ENCOUNTER — Other Ambulatory Visit: Payer: Self-pay | Admitting: Family Medicine

## 2018-07-26 ENCOUNTER — Encounter: Payer: Self-pay | Admitting: Family Medicine

## 2018-07-26 ENCOUNTER — Ambulatory Visit (INDEPENDENT_AMBULATORY_CARE_PROVIDER_SITE_OTHER): Payer: BLUE CROSS/BLUE SHIELD | Admitting: Family Medicine

## 2018-07-26 VITALS — BP 140/86 | HR 76 | Temp 97.7°F | Resp 18 | Ht 70.0 in | Wt 263.0 lb

## 2018-07-26 DIAGNOSIS — I10 Essential (primary) hypertension: Secondary | ICD-10-CM

## 2018-07-26 DIAGNOSIS — Z23 Encounter for immunization: Secondary | ICD-10-CM

## 2018-07-26 DIAGNOSIS — E119 Type 2 diabetes mellitus without complications: Secondary | ICD-10-CM

## 2018-07-26 DIAGNOSIS — Z125 Encounter for screening for malignant neoplasm of prostate: Secondary | ICD-10-CM

## 2018-07-26 DIAGNOSIS — E7800 Pure hypercholesterolemia, unspecified: Secondary | ICD-10-CM | POA: Diagnosis not present

## 2018-07-26 NOTE — Addendum Note (Signed)
Addended by: Legrand Rams B on: 07/26/2018 02:17 PM   Modules accepted: Orders

## 2018-07-26 NOTE — Progress Notes (Signed)
Subjective:    Patient ID: James ShieldsMichael Laye, male    DOB: 03/20/1956, 63 y.o.   MRN: [id number]  HPI Patient has not been seen in quite some time.  He has a history of type 2 diabetes that is poorly controlled.  He states that he is taking glipizide, metformin, and Januvia.  He seldom checks his sugar.  When he does check his sugar fasting is between 150 and 170.  He is not checking 2-hour postprandial sugars.  He denies polyuria, polydipsia, or blurry vision.  He denies chest pain shortness of breath or dyspnea on exertion.  He denies myalgias or right upper quadrant pain on a statin.  He is overdue for prostate cancer screening.  He is also due for Pneumovax 23.  He has had his flu shot at an outside pharmacy.  Diabetic foot exam is performed today and is normal Past Medical History:  Diagnosis Date  . Arthritis   . Diabetes mellitus type II, uncontrolled (HCC)   . Diverticulosis   . Hyperlipidemia   . Hypertension    No past surgical history on file. Current Outpatient Medications on File Prior to Visit  Medication Sig Dispense Refill  . aspirin EC 81 MG tablet Take 81 mg by mouth daily.    Marland Kitchen. azelastine (ASTELIN) 0.1 % nasal spray USE 2 SPRAYS IN EACH NOSTRIL TWICE DAILY 90 mL 3  . celecoxib (CELEBREX) 100 MG capsule TAKE 1 CAPSULE (100 MG TOTAL) BY MOUTH DAILY. 90 capsule 1  . fish oil-omega-3 fatty acids 1000 MG capsule Take 1-2 g by mouth 2 (two) times daily. 2 capsules in morning, and 1 in evening, daily    . fluticasone (FLONASE) 50 MCG/ACT nasal spray USE 2 SPRAYS EACH NOSTRIL DAILY 48 g 2  . glipiZIDE (GLUCOTROL XL) 10 MG 24 hr tablet TAKE 1 TABLET (10 MG TOTAL) BY MOUTH DAILY WITH BREAKFAST. 90 tablet 3  . Glucosamine-Chondroitin (GLUCOSAMINE CHONDR COMPLEX PO) Take 1 tablet by mouth 2 (two) times daily.    Marland Kitchen. glucose blood test strip Use to check blood sugar twice daily.*Dispense Contour next test strips* 100 each 12  . JANUVIA 100 MG tablet TAKE 1 TABLET BY MOUTH EVERY DAY.  DISCONTINUE JARDIANCE 30 tablet 2  . loratadine (CLARITIN) 10 MG tablet Take 1 tablet (10 mg total) by mouth daily. 30 tablet 11  . metFORMIN (GLUCOPHAGE) 1000 MG tablet Take 1 tablet (1,000 mg total) by mouth 2 (two) times daily with a meal. 180 tablet 3  . Multiple Vitamin (MULTIVITAMIN) tablet Take 1 tablet by mouth daily.    . rosuvastatin (CRESTOR) 10 MG tablet TAKE 1 TABLET BY MOUTH EVERY DAY 90 tablet 0  . rosuvastatin (CRESTOR) 10 MG tablet TAKE 1 TABLET BY MOUTH EVERY DAY 90 tablet 1  . valsartan-hydrochlorothiazide (DIOVAN-HCT) 160-25 MG tablet TAKE 1 TABLET BY MOUTH DAILY. 90 tablet 1  . hydrocortisone (ANUSOL-HC) 2.5 % rectal cream Place 1 application rectally 2 (two) times daily. (Patient not taking: Reported on 07/26/2018) 30 g 0   No current facility-administered medications on file prior to visit.    Allergies  Allergen Reactions  . Lipitor [Atorvastatin] Other (See Comments)    Muscle cramps, cant walk, sluggish  . Sulfa Antibiotics Itching and Rash   Social History   Socioeconomic History  . Marital status: Single    Spouse name: Not on file  . Number of children: Not on file  . Years of education: Not on file  . Highest education level: Not  on file  Occupational History  . Not on file  Social Needs  . Financial resource strain: Not on file  . Food insecurity:    Worry: Not on file    Inability: Not on file  . Transportation needs:    Medical: Not on file    Non-medical: Not on file  Tobacco Use  . Smoking status: Never Smoker  . Smokeless tobacco: Current User    Types: Chew  Substance and Sexual Activity  . Alcohol use: No  . Drug use: No  . Sexual activity: Not on file  Lifestyle  . Physical activity:    Days per week: Not on file    Minutes per session: Not on file  . Stress: Not on file  Relationships  . Social connections:    Talks on phone: Not on file    Gets together: Not on file    Attends religious service: Not on file    Active member  of club or organization: Not on file    Attends meetings of clubs or organizations: Not on file    Relationship status: Not on file  . Intimate partner violence:    Fear of current or ex partner: Not on file    Emotionally abused: Not on file    Physically abused: Not on file    Forced sexual activity: Not on file  Other Topics Concern  . Not on file  Social History Narrative  . Not on file     Review of Systems  All other systems reviewed and are negative.      Objective:   Physical Exam Vitals signs reviewed.  Constitutional:      General: He is not in acute distress.    Appearance: Normal appearance. He is normal weight. He is not ill-appearing.  Cardiovascular:     Rate and Rhythm: Normal rate and regular rhythm.     Pulses: Normal pulses.     Heart sounds: Normal heart sounds. No murmur. No friction rub. No gallop.   Pulmonary:     Effort: Pulmonary effort is normal. No respiratory distress.     Breath sounds: Normal breath sounds. No wheezing, rhonchi or rales.  Musculoskeletal:     Right lower leg: No edema.     Left lower leg: No edema.  Neurological:     Mental Status: He is alert.           Assessment & Plan:  Diabetes mellitus without complication (HCC) - Plan: Hemoglobin A1c, CBC with Differential/Platelet, COMPLETE METABOLIC PANEL WITH GFR, Lipid panel, Microalbumin, urine  Pure hypercholesterolemia  Benign essential HTN  Prostate cancer screening - Plan: PSA  Patient's diabetes sounds poorly controlled.  Furthermore he is paying $150 for Januvia which seems ridiculous.  I will check his hemoglobin A1c.  I suspect his hemoglobin A1c will be 8 or higher.  If so I would recommend discontinuing Januvia and switching to Trulicity or Ozempic to achieve better blood sugar control and also facilitate weight loss as his weight continues to increase.  His blood pressure today is borderline.  Strongly encouraged weight loss.  Patient received Pneumovax 23.   Diabetic foot exam is normal.  Check CBC, CMP, fasting lipid panel, and urine microalbumin.  Also screen for prostate cancer with a PSA.

## 2018-07-27 LAB — LIPID PANEL
Cholesterol: 115 mg/dL (ref <200)
HDL: 42 mg/dL (ref >40)
LDL CHOLESTEROL (CALC): 54 mg/dL
NON-HDL CHOLESTEROL (CALC): 73 mg/dL (ref <130)
TRIGLYCERIDES: 102 mg/dL (ref <150)
Total CHOL/HDL Ratio: 2.7 (calc) (ref <5.0)

## 2018-07-27 LAB — CBC WITH DIFFERENTIAL/PLATELET
Absolute Monocytes: 718 cells/uL (ref 200–950)
Basophils Absolute: 59 cells/uL (ref 0–200)
Basophils Relative: 0.8 %
Eosinophils Absolute: 326 cells/uL (ref 15–500)
Eosinophils Relative: 4.4 %
HCT: 47 % (ref 38.5–50.0)
Hemoglobin: 16.3 g/dL (ref 13.2–17.1)
LYMPHS ABS: 1036 {cells}/uL (ref 850–3900)
MCH: 30.3 pg (ref 27.0–33.0)
MCHC: 34.7 g/dL (ref 32.0–36.0)
MCV: 87.4 fL (ref 80.0–100.0)
MPV: 10.1 fL (ref 7.5–12.5)
Monocytes Relative: 9.7 %
NEUTROS PCT: 71.1 %
Neutro Abs: 5261 cells/uL (ref 1500–7800)
PLATELETS: 253 10*3/uL (ref 140–400)
RBC: 5.38 10*6/uL (ref 4.20–5.80)
RDW: 13.4 % (ref 11.0–15.0)
TOTAL LYMPHOCYTE: 14 %
WBC: 7.4 10*3/uL (ref 3.8–10.8)

## 2018-07-27 LAB — COMPLETE METABOLIC PANEL WITH GFR
AG Ratio: 1.7 (calc) (ref 1.0–2.5)
ALT: 35 U/L (ref 9–46)
AST: 26 U/L (ref 10–35)
Albumin: 4.5 g/dL (ref 3.6–5.1)
Alkaline phosphatase (APISO): 64 U/L (ref 40–115)
BUN: 14 mg/dL (ref 7–25)
CALCIUM: 9.8 mg/dL (ref 8.6–10.3)
CO2: 30 mmol/L (ref 20–32)
CREATININE: 1.03 mg/dL (ref 0.70–1.25)
Chloride: 100 mmol/L (ref 98–110)
GFR, EST NON AFRICAN AMERICAN: 78 mL/min/{1.73_m2} (ref >=60)
GFR, Est African American: 90 mL/min/{1.73_m2} (ref >=60)
Globulin: 2.6 g/dL (calc) (ref 1.9–3.7)
Glucose, Bld: 163 mg/dL — ABNORMAL HIGH (ref 65–99)
Potassium: 4.4 mmol/L (ref 3.5–5.3)
Sodium: 139 mmol/L (ref 135–146)
Total Bilirubin: 0.6 mg/dL (ref 0.2–1.2)
Total Protein: 7.1 g/dL (ref 6.1–8.1)

## 2018-07-27 LAB — HEMOGLOBIN A1C
EAG (MMOL/L): 9.3 (calc)
HEMOGLOBIN A1C: 7.5 %{Hb} — AB (ref <5.7)
MEAN PLASMA GLUCOSE: 169 (calc)

## 2018-07-27 LAB — PSA: PSA: 0.4 ng/mL (ref <=4.0)

## 2018-07-27 LAB — MICROALBUMIN, URINE: Microalb, Ur: 0.5 mg/dL

## 2018-07-28 ENCOUNTER — Other Ambulatory Visit: Payer: Self-pay | Admitting: Family Medicine

## 2018-07-28 MED ORDER — DULAGLUTIDE 0.75 MG/0.5ML ~~LOC~~ SOAJ: 0.7500 mg | SUBCUTANEOUS | 0 refills | Status: DC

## 2018-07-29 ENCOUNTER — Other Ambulatory Visit: Payer: Self-pay | Admitting: Family Medicine

## 2018-08-22 ENCOUNTER — Telehealth: Payer: Self-pay | Admitting: Family Medicine

## 2018-08-22 ENCOUNTER — Other Ambulatory Visit: Payer: Self-pay | Admitting: Family Medicine

## 2018-08-22 NOTE — Telephone Encounter (Signed)
Pt called about his trulicity, states that he was suppose to call when he was out so we could up his dosage, also it is really costly almost $450.00 and after coupon its like $400 - $425.00 wants to know if we have a different coupon that would help more.

## 2018-08-23 NOTE — Telephone Encounter (Signed)
Will insurance cover victoza 1.2 mg sq daily instead?  If not actos 30 mg a day and d/c trulicity IF victoza is not cheaper.

## 2018-08-24 MED ORDER — PIOGLITAZONE HCL 30 MG PO TABS: 30.0000 mg | ORAL_TABLET | Freq: Every day | ORAL | 5 refills | Status: DC

## 2018-08-24 NOTE — Telephone Encounter (Signed)
Called and spoke to wife and he does have a deductible ins - actos sent to [facility]

## 2018-08-30 DIAGNOSIS — E119 Type 2 diabetes mellitus without complications: Secondary | ICD-10-CM | POA: Diagnosis not present

## 2018-08-30 LAB — HM DIABETES EYE EXAM

## 2018-09-02 ENCOUNTER — Encounter: Payer: Self-pay | Admitting: *Deleted

## 2018-09-30 ENCOUNTER — Other Ambulatory Visit: Payer: Self-pay | Admitting: Family Medicine

## 2018-11-09 ENCOUNTER — Other Ambulatory Visit: Payer: Self-pay | Admitting: Family Medicine

## 2018-11-09 MED ORDER — ROSUVASTATIN CALCIUM 10 MG PO TABS: 10.0000 mg | ORAL_TABLET | Freq: Every day | ORAL | 2 refills | Status: DC

## 2018-12-14 ENCOUNTER — Other Ambulatory Visit: Payer: Self-pay | Admitting: Family Medicine

## 2019-02-13 ENCOUNTER — Other Ambulatory Visit: Payer: Self-pay | Admitting: Family Medicine

## 2019-02-19 ENCOUNTER — Other Ambulatory Visit: Payer: Self-pay | Admitting: Family Medicine

## 2019-02-21 ENCOUNTER — Other Ambulatory Visit: Payer: Self-pay | Admitting: Family Medicine

## 2019-03-07 DIAGNOSIS — L821 Other seborrheic keratosis: Secondary | ICD-10-CM | POA: Diagnosis not present

## 2019-03-07 DIAGNOSIS — D485 Neoplasm of uncertain behavior of skin: Secondary | ICD-10-CM | POA: Diagnosis not present

## 2019-03-07 DIAGNOSIS — Z85828 Personal history of other malignant neoplasm of skin: Secondary | ICD-10-CM | POA: Diagnosis not present

## 2019-03-07 DIAGNOSIS — D225 Melanocytic nevi of trunk: Secondary | ICD-10-CM | POA: Diagnosis not present

## 2019-03-07 DIAGNOSIS — L570 Actinic keratosis: Secondary | ICD-10-CM | POA: Diagnosis not present

## 2019-03-07 DIAGNOSIS — D1801 Hemangioma of skin and subcutaneous tissue: Secondary | ICD-10-CM | POA: Diagnosis not present

## 2019-03-07 DIAGNOSIS — C4442 Squamous cell carcinoma of skin of scalp and neck: Secondary | ICD-10-CM | POA: Diagnosis not present

## 2019-03-16 DIAGNOSIS — C4442 Squamous cell carcinoma of skin of scalp and neck: Secondary | ICD-10-CM | POA: Diagnosis not present

## 2019-03-16 DIAGNOSIS — D485 Neoplasm of uncertain behavior of skin: Secondary | ICD-10-CM | POA: Diagnosis not present

## 2019-03-16 DIAGNOSIS — C44229 Squamous cell carcinoma of skin of left ear and external auricular canal: Secondary | ICD-10-CM | POA: Diagnosis not present

## 2019-03-22 ENCOUNTER — Other Ambulatory Visit: Payer: Self-pay | Admitting: Family Medicine

## 2019-04-24 DIAGNOSIS — L570 Actinic keratosis: Secondary | ICD-10-CM | POA: Diagnosis not present

## 2019-04-24 DIAGNOSIS — C44229 Squamous cell carcinoma of skin of left ear and external auricular canal: Secondary | ICD-10-CM | POA: Diagnosis not present

## 2019-05-11 ENCOUNTER — Other Ambulatory Visit: Payer: Self-pay | Admitting: Family Medicine

## 2019-06-11 ENCOUNTER — Other Ambulatory Visit: Payer: Self-pay | Admitting: Family Medicine

## 2019-06-19 DIAGNOSIS — C44229 Squamous cell carcinoma of skin of left ear and external auricular canal: Secondary | ICD-10-CM | POA: Diagnosis not present

## 2019-06-21 DIAGNOSIS — B353 Tinea pedis: Secondary | ICD-10-CM | POA: Diagnosis not present

## 2019-06-21 DIAGNOSIS — D225 Melanocytic nevi of trunk: Secondary | ICD-10-CM | POA: Diagnosis not present

## 2019-06-21 DIAGNOSIS — L821 Other seborrheic keratosis: Secondary | ICD-10-CM | POA: Diagnosis not present

## 2019-06-21 DIAGNOSIS — D1801 Hemangioma of skin and subcutaneous tissue: Secondary | ICD-10-CM | POA: Diagnosis not present

## 2019-06-21 DIAGNOSIS — L570 Actinic keratosis: Secondary | ICD-10-CM | POA: Diagnosis not present

## 2019-08-06 ENCOUNTER — Other Ambulatory Visit: Payer: Self-pay | Admitting: Family Medicine

## 2019-08-11 ENCOUNTER — Encounter: Payer: Self-pay | Admitting: Family Medicine

## 2019-08-11 ENCOUNTER — Other Ambulatory Visit: Payer: Self-pay

## 2019-08-11 ENCOUNTER — Ambulatory Visit (INDEPENDENT_AMBULATORY_CARE_PROVIDER_SITE_OTHER): Payer: BC Managed Care – PPO | Admitting: Family Medicine

## 2019-08-11 ENCOUNTER — Other Ambulatory Visit: Payer: Self-pay | Admitting: Family Medicine

## 2019-08-11 VITALS — BP 154/84 | HR 80 | Temp 98.0°F | Resp 18 | Ht 70.0 in | Wt 282.0 lb

## 2019-08-11 DIAGNOSIS — Z23 Encounter for immunization: Secondary | ICD-10-CM | POA: Diagnosis not present

## 2019-08-11 DIAGNOSIS — E7800 Pure hypercholesterolemia, unspecified: Secondary | ICD-10-CM | POA: Diagnosis not present

## 2019-08-11 DIAGNOSIS — Z125 Encounter for screening for malignant neoplasm of prostate: Secondary | ICD-10-CM | POA: Diagnosis not present

## 2019-08-11 DIAGNOSIS — I10 Essential (primary) hypertension: Secondary | ICD-10-CM | POA: Diagnosis not present

## 2019-08-11 DIAGNOSIS — E119 Type 2 diabetes mellitus without complications: Secondary | ICD-10-CM

## 2019-08-11 NOTE — Progress Notes (Signed)
Subjective:    Patient ID: James Ponce, male    DOB: 09/12/1955, 64 y.o.   MRN: [id number]  HPI Patient is here today for a checkup.  He has not been seen in more than a year.  He is gaining weight.  He is not checking his sugar regularly when he does he has seen his sugars in the 170s.  He believes that his medicines are no longer working.  He admits to not exercising regularly.  He also admits to not eating properly.  He is taking glipizide, Actos, and Metformin primarily for cost reasons.  He was unable to afford Jardiance.  He was unable to afford Trulicity although he did like the Trulicity.  His insurance however dictates generic medications which are his only affordable option.  He refuses a flu shot.  He denies any chest pain shortness of breath or dyspnea on exertion.  He denies any neuropathy in his feet.  He denies any polyuria, polydipsia, or blurry vision.  He is overdue for prostate cancer screening. Past Medical History:  Diagnosis Date  . Arthritis   . Diabetes mellitus type II, uncontrolled (HCC)   . Diverticulosis   . Hyperlipidemia   . Hypertension    No past surgical history on file. Current Outpatient Medications on File Prior to Visit  Medication Sig Dispense Refill  . aspirin EC 81 MG tablet Take 81 mg by mouth daily.    Marland Kitchen azelastine (ASTELIN) 0.1 % nasal spray INHALE 2 SPRAYS INTO EACH NOSTRIL TWICE A DAY 90 mL 3  . celecoxib (CELEBREX) 100 MG capsule TAKE 1 CAPSULE BY MOUTH EVERY DAY 90 capsule 1  . fish oil-omega-3 fatty acids 1000 MG capsule Take 1-2 g by mouth 2 (two) times daily. 2 capsules in morning, and 1 in evening, daily    . fluticasone (FLONASE) 50 MCG/ACT nasal spray USE 2 SPRAYS IN EACH NOSTRIL DAILY 48 mL 2  . GLIPIZIDE XL 10 MG 24 hr tablet TAKE 1 TABLET (10 MG TOTAL) BY MOUTH DAILY WITH BREAKFAST. 90 tablet 3  . Glucosamine-Chondroitin (GLUCOSAMINE CHONDR COMPLEX PO) Take 1 tablet by mouth 2 (two) times daily.    Marland Kitchen glucose blood test strip Use to  check blood sugar twice daily.*Dispense Contour next test strips* 100 each 12  . loratadine (CLARITIN) 10 MG tablet Take 1 tablet (10 mg total) by mouth daily. 30 tablet 11  . metFORMIN (GLUCOPHAGE) 1000 MG tablet TAKE 1 TABLET (1,000 MG TOTAL) BY MOUTH 2 (TWO) TIMES DAILY WITH A MEAL. 180 tablet 3  . Multiple Vitamin (MULTIVITAMIN) tablet Take 1 tablet by mouth daily.    . pioglitazone (ACTOS) 30 MG tablet TAKE 1 TABLET BY MOUTH EVERY DAY 90 tablet 2  . rosuvastatin (CRESTOR) 10 MG tablet TAKE 1 TABLET BY MOUTH EVERY DAY 90 tablet 2  . valsartan-hydrochlorothiazide (DIOVAN-HCT) 160-25 MG tablet TAKE 1 TABLET BY MOUTH EVERY DAY 90 tablet 1  . Dulaglutide 0.75 MG/0.5ML SOPN INJECT 1 PEN INTO THE SKIN ONCE A WEEK. (Patient not taking: Reported on 08/11/2019) 4 pen 0   No current facility-administered medications on file prior to visit.   Allergies  Allergen Reactions  . Lipitor [Atorvastatin] Other (See Comments)    Muscle cramps, cant walk, sluggish  . Sulfa Antibiotics Itching and Rash   Social History   Socioeconomic History  . Marital status: Single    Spouse name: Not on file  . Number of children: Not on file  . Years of education: Not on  file  . Highest education level: Not on file  Occupational History  . Not on file  Tobacco Use  . Smoking status: Never Smoker  . Smokeless tobacco: Current User    Types: Chew  Substance and Sexual Activity  . Alcohol use: No  . Drug use: No  . Sexual activity: Not on file  Other Topics Concern  . Not on file  Social History Narrative  . Not on file   Social Determinants of Health   Financial Resource Strain:   . Difficulty of Paying Living Expenses: Not on file  Food Insecurity:   . Worried About Charity fundraiser in the Last Year: Not on file  . Ran Out of Food in the Last Year: Not on file  Transportation Needs:   . Lack of Transportation (Medical): Not on file  . Lack of Transportation (Non-Medical): Not on file  Physical  Activity:   . Days of Exercise per Week: Not on file  . Minutes of Exercise per Session: Not on file  Stress:   . Feeling of Stress : Not on file  Social Connections:   . Frequency of Communication with Friends and Family: Not on file  . Frequency of Social Gatherings with Friends and Family: Not on file  . Attends Religious Services: Not on file  . Active Member of Clubs or Organizations: Not on file  . Attends Archivist Meetings: Not on file  . Marital Status: Not on file  Intimate Partner Violence:   . Fear of Current or Ex-Partner: Not on file  . Emotionally Abused: Not on file  . Physically Abused: Not on file  . Sexually Abused: Not on file     Review of Systems  All other systems reviewed and are negative.      Objective:   Physical Exam Vitals reviewed.  Constitutional:      General: He is not in acute distress.    Appearance: Normal appearance. He is normal weight. He is not ill-appearing.  Cardiovascular:     Rate and Rhythm: Normal rate and regular rhythm.     Pulses: Normal pulses.     Heart sounds: Normal heart sounds. No murmur. No friction rub. No gallop.   Pulmonary:     Effort: Pulmonary effort is normal. No respiratory distress.     Breath sounds: Normal breath sounds. No wheezing, rhonchi or rales.  Musculoskeletal:     Right lower leg: No edema.     Left lower leg: No edema.  Neurological:     Mental Status: He is alert.           Assessment & Plan:  Diabetes mellitus without complication ([place]) - Plan: Hemoglobin A1c, COMPLETE METABOLIC PANEL WITH GFR, CBC with Differential/Platelet, Microalbumin, urine  Pure hypercholesterolemia - Plan: Lipid panel  Benign essential HTN  Prostate cancer screening - Plan: PSA  I suspect the patient's diabetes is out of control.  We will check a hemoglobin A1c today.  If significantly elevated I would recommend starting Farxiga 10 mg a day as well as possibly Ozempic.  I asked the patient to  check his insurance to see which namebrand medicines are covered so that we can start him on something that could be affordable.  I will also check a fasting lipid panel.  Goal LDL cholesterol is less than 100.  I will check a urine microalbumin to screen for diabetic nephropathy.  I will check a PSA to screen for prostate cancer.  The patient has an elevated blood pressure today but he believes some of this could be anxiety in the office.  He will have his wife check it daily at home and notify me of the values in 1 week.  If consistently greater than 140/90 we will need to increase his blood pressure medication.  Patient was offered a flu shot.

## 2019-08-11 NOTE — Addendum Note (Signed)
Addended by: Legrand Rams B on: 08/11/2019 10:57 AM   Modules accepted: Orders

## 2019-08-12 LAB — LIPID PANEL
Cholesterol: 132 mg/dL (ref <200)
HDL: 51 mg/dL (ref >=40)
LDL Cholesterol (Calc): 62 mg/dL (calc)
Non-HDL Cholesterol (Calc): 81 mg/dL (calc) (ref <130)
Total CHOL/HDL Ratio: 2.6 (calc) (ref <5.0)
Triglycerides: 107 mg/dL (ref <150)

## 2019-08-12 LAB — COMPLETE METABOLIC PANEL WITH GFR
AG Ratio: 1.6 (calc) (ref 1.0–2.5)
ALT: 26 U/L (ref 9–46)
AST: 17 U/L (ref 10–35)
Albumin: 4.3 g/dL (ref 3.6–5.1)
Alkaline phosphatase (APISO): 71 U/L (ref 35–144)
BUN: 21 mg/dL (ref 7–25)
CO2: 28 mmol/L (ref 20–32)
Calcium: 9.9 mg/dL (ref 8.6–10.3)
Chloride: 98 mmol/L (ref 98–110)
Creat: 0.98 mg/dL (ref 0.70–1.25)
GFR, Est African American: 95 mL/min/{1.73_m2} (ref >=60)
GFR, Est Non African American: 82 mL/min/{1.73_m2} (ref >=60)
Globulin: 2.7 g/dL (calc) (ref 1.9–3.7)
Glucose, Bld: 134 mg/dL — ABNORMAL HIGH (ref 65–99)
Potassium: 4.2 mmol/L (ref 3.5–5.3)
Sodium: 137 mmol/L (ref 135–146)
Total Bilirubin: 0.4 mg/dL (ref 0.2–1.2)
Total Protein: 7 g/dL (ref 6.1–8.1)

## 2019-08-12 LAB — PSA: PSA: 0.4 ng/mL (ref <=4.0)

## 2019-08-12 LAB — CBC WITH DIFFERENTIAL/PLATELET
Absolute Monocytes: 525 cells/uL (ref 200–950)
Basophils Absolute: 58 cells/uL (ref 0–200)
Basophils Relative: 0.9 %
Eosinophils Absolute: 166 cells/uL (ref 15–500)
Eosinophils Relative: 2.6 %
HCT: 45.7 % (ref 38.5–50.0)
Hemoglobin: 15.3 g/dL (ref 13.2–17.1)
Lymphs Abs: 1165 cells/uL (ref 850–3900)
MCH: 29.9 pg (ref 27.0–33.0)
MCHC: 33.5 g/dL (ref 32.0–36.0)
MCV: 89.4 fL (ref 80.0–100.0)
MPV: 9.9 fL (ref 7.5–12.5)
Monocytes Relative: 8.2 %
Neutro Abs: 4486 cells/uL (ref 1500–7800)
Neutrophils Relative %: 70.1 %
Platelets: 243 10*3/uL (ref 140–400)
RBC: 5.11 10*6/uL (ref 4.20–5.80)
RDW: 13 % (ref 11.0–15.0)
Total Lymphocyte: 18.2 %
WBC: 6.4 10*3/uL (ref 3.8–10.8)

## 2019-08-12 LAB — HEMOGLOBIN A1C
Hgb A1c MFr Bld: 6.6 % of total Hgb — ABNORMAL HIGH (ref <5.7)
Mean Plasma Glucose: 143 (calc)
eAG (mmol/L): 7.9 (calc)

## 2019-08-12 LAB — MICROALBUMIN, URINE: Microalb, Ur: 0.2 mg/dL

## 2019-09-01 DIAGNOSIS — E119 Type 2 diabetes mellitus without complications: Secondary | ICD-10-CM | POA: Diagnosis not present

## 2019-09-01 LAB — HM DIABETES EYE EXAM

## 2019-09-28 ENCOUNTER — Encounter: Payer: Self-pay | Admitting: *Deleted

## 2019-10-01 ENCOUNTER — Encounter: Payer: Self-pay | Admitting: Family Medicine

## 2019-10-02 MED ORDER — DAPAGLIFLOZIN PROPANEDIOL 10 MG PO TABS: 10.0000 mg | ORAL_TABLET | Freq: Every day | ORAL | 5 refills | Status: DC

## 2019-11-04 ENCOUNTER — Other Ambulatory Visit: Payer: Self-pay | Admitting: Family Medicine

## 2019-12-03 ENCOUNTER — Other Ambulatory Visit: Payer: Self-pay | Admitting: Family Medicine

## 2020-01-12 ENCOUNTER — Other Ambulatory Visit: Payer: Self-pay

## 2020-01-12 ENCOUNTER — Ambulatory Visit (INDEPENDENT_AMBULATORY_CARE_PROVIDER_SITE_OTHER): Payer: BC Managed Care – PPO | Admitting: Family Medicine

## 2020-01-12 VITALS — BP 138/68 | HR 93 | Temp 97.5°F | Ht 70.0 in | Wt 275.0 lb

## 2020-01-12 DIAGNOSIS — L723 Sebaceous cyst: Secondary | ICD-10-CM

## 2020-01-12 MED ORDER — DOXYCYCLINE HYCLATE 100 MG PO TABS: 100.0000 mg | ORAL_TABLET | Freq: Two times a day (BID) | ORAL | 0 refills | Status: DC

## 2020-01-12 MED ORDER — DULAGLUTIDE 0.75 MG/0.5ML ~~LOC~~ SOAJ: SUBCUTANEOUS | 3 refills | Status: DC

## 2020-01-12 NOTE — Progress Notes (Signed)
Subjective:    Patient ID: James Ponce, male    DOB: April 30, 1956, 64 y.o.   MRN: [id number]  HPI  Patient has a large infected sebaceous cyst on his posterior neck on the right side. It is approximately 4 cm in diameter on the outside. There is erythema spreading towards his ear and into his neck on the right side. It has been there since 2018 and was I+D then but it has returned and doubled in size in the last 2 weeks. It is extremely painful. Past Medical History:  Diagnosis Date  . Arthritis   . Diabetes mellitus type II, uncontrolled (HCC)   . Diverticulosis   . Hyperlipidemia   . Hypertension    No past surgical history on file. Current Outpatient Medications on File Prior to Visit  Medication Sig Dispense Refill  . aspirin EC 81 MG tablet Take 81 mg by mouth daily.    Marland Kitchen azelastine (ASTELIN) 0.1 % nasal spray INHALE 2 SPRAYS INTO EACH NOSTRIL TWICE A DAY 30 mL 3  . celecoxib (CELEBREX) 100 MG capsule TAKE 1 CAPSULE BY MOUTH EVERY DAY 90 capsule 1  . dapagliflozin propanediol (FARXIGA) 10 MG TABS tablet Take 10 mg by mouth daily before breakfast. 30 tablet 5  . fish oil-omega-3 fatty acids 1000 MG capsule Take 1-2 g by mouth 2 (two) times daily. 2 capsules in morning, and 1 in evening, daily    . fluticasone (FLONASE) 50 MCG/ACT nasal spray SPRAY 2 SPRAYS INTO EACH NOSTRIL EVERY DAY 48 mL 2  . Glucosamine-Chondroitin (GLUCOSAMINE CHONDR COMPLEX PO) Take 1 tablet by mouth 2 (two) times daily.    Marland Kitchen glucose blood test strip Use to check blood sugar twice daily.*Dispense Contour next test strips* 100 each 12  . loratadine (CLARITIN) 10 MG tablet Take 1 tablet (10 mg total) by mouth daily. 30 tablet 11  . metFORMIN (GLUCOPHAGE) 1000 MG tablet TAKE 1 TABLET (1,000 MG TOTAL) BY MOUTH 2 (TWO) TIMES DAILY WITH A MEAL. 180 tablet 3  . Multiple Vitamin (MULTIVITAMIN) tablet Take 1 tablet by mouth daily.    . pioglitazone (ACTOS) 30 MG tablet TAKE 1 TABLET BY MOUTH EVERY DAY 90 tablet 2  .  rosuvastatin (CRESTOR) 10 MG tablet TAKE 1 TABLET BY MOUTH EVERY DAY 90 tablet 2  . valsartan-hydrochlorothiazide (DIOVAN-HCT) 160-25 MG tablet TAKE 1 TABLET BY MOUTH EVERY DAY 90 tablet 1   No current facility-administered medications on file prior to visit.   Allergies  Allergen Reactions  . Lipitor [Atorvastatin] Other (See Comments)    Muscle cramps, cant walk, sluggish  . Sulfa Antibiotics Itching and Rash   Social History   Socioeconomic History  . Marital status: Single    Spouse name: Not on file  . Number of children: Not on file  . Years of education: Not on file  . Highest education level: Not on file  Occupational History  . Not on file  Tobacco Use  . Smoking status: Never Smoker  . Smokeless tobacco: Current User    Types: Chew  Substance and Sexual Activity  . Alcohol use: No  . Drug use: No  . Sexual activity: Not on file  Other Topics Concern  . Not on file  Social History Narrative  . Not on file   Social Determinants of Health   Financial Resource Strain:   . Difficulty of Paying Living Expenses:   Food Insecurity:   . Worried About Programme researcher, broadcasting/film/video in the Last Year:   .  Ran Out of Food in the Last Year:   Transportation Needs:   . Freight forwarder (Medical):   Marland Kitchen Lack of Transportation (Non-Medical):   Physical Activity:   . Days of Exercise per Week:   . Minutes of Exercise per Session:   Stress:   . Feeling of Stress :   Social Connections:   . Frequency of Communication with Friends and Family:   . Frequency of Social Gatherings with Friends and Family:   . Attends Religious Services:   . Active Member of Clubs or Organizations:   . Attends Banker Meetings:   Marland Kitchen Marital Status:   Intimate Partner Violence:   . Fear of Current or Ex-Partner:   . Emotionally Abused:   Marland Kitchen Physically Abused:   . Sexually Abused:    Current Outpatient Medications on File Prior to Visit  Medication Sig Dispense Refill  . aspirin EC  81 MG tablet Take 81 mg by mouth daily.    Marland Kitchen azelastine (ASTELIN) 0.1 % nasal spray INHALE 2 SPRAYS INTO EACH NOSTRIL TWICE A DAY 30 mL 3  . celecoxib (CELEBREX) 100 MG capsule TAKE 1 CAPSULE BY MOUTH EVERY DAY 90 capsule 1  . dapagliflozin propanediol (FARXIGA) 10 MG TABS tablet Take 10 mg by mouth daily before breakfast. 30 tablet 5  . fish oil-omega-3 fatty acids 1000 MG capsule Take 1-2 g by mouth 2 (two) times daily. 2 capsules in morning, and 1 in evening, daily    . fluticasone (FLONASE) 50 MCG/ACT nasal spray SPRAY 2 SPRAYS INTO EACH NOSTRIL EVERY DAY 48 mL 2  . Glucosamine-Chondroitin (GLUCOSAMINE CHONDR COMPLEX PO) Take 1 tablet by mouth 2 (two) times daily.    Marland Kitchen glucose blood test strip Use to check blood sugar twice daily.*Dispense Contour next test strips* 100 each 12  . loratadine (CLARITIN) 10 MG tablet Take 1 tablet (10 mg total) by mouth daily. 30 tablet 11  . metFORMIN (GLUCOPHAGE) 1000 MG tablet TAKE 1 TABLET (1,000 MG TOTAL) BY MOUTH 2 (TWO) TIMES DAILY WITH A MEAL. 180 tablet 3  . Multiple Vitamin (MULTIVITAMIN) tablet Take 1 tablet by mouth daily.    . pioglitazone (ACTOS) 30 MG tablet TAKE 1 TABLET BY MOUTH EVERY DAY 90 tablet 2  . rosuvastatin (CRESTOR) 10 MG tablet TAKE 1 TABLET BY MOUTH EVERY DAY 90 tablet 2  . valsartan-hydrochlorothiazide (DIOVAN-HCT) 160-25 MG tablet TAKE 1 TABLET BY MOUTH EVERY DAY 90 tablet 1   No current facility-administered medications on file prior to visit.     Review of Systems  Musculoskeletal: Positive for neck pain.  All other systems reviewed and are negative.      Objective:   Physical Exam Vitals reviewed.  Neck:   Cardiovascular:     Rate and Rhythm: Normal rate and regular rhythm.     Heart sounds: Normal heart sounds.  Pulmonary:     Effort: Pulmonary effort is normal. No respiratory distress.     Breath sounds: Normal breath sounds. No wheezing or rales.  Skin:    Findings: Erythema present.             Assessment & Plan:  Inflamed sebaceous cyst  Area was anesthetized with 0.1% lidocaine with epinephrine. A 1 cm vertical incision was made in the center of the infected cyst. Copious purulent material was expressed. Hemostats were introduced into the opening and the wound was probed to a depth of approximately 2 inches towards the midline of his neck. The wound was  then probed with Q-tips soaked in hydrogen peroxide and thoroughly cleaned. It was then packed with 3 inches of one quarter-inch iodoform gauze. Begin doxycycline 100 mg by mouth twice a day for 7  days.

## 2020-01-31 ENCOUNTER — Other Ambulatory Visit: Payer: Self-pay

## 2020-01-31 MED ORDER — AZELASTINE HCL 0.1 % NA SOLN: NASAL | 3 refills | Status: DC

## 2020-02-05 ENCOUNTER — Other Ambulatory Visit: Payer: Self-pay | Admitting: Family Medicine

## 2020-03-25 ENCOUNTER — Ambulatory Visit (INDEPENDENT_AMBULATORY_CARE_PROVIDER_SITE_OTHER): Payer: BC Managed Care – PPO | Admitting: Family Medicine

## 2020-03-25 ENCOUNTER — Other Ambulatory Visit: Payer: Self-pay

## 2020-03-25 VITALS — BP 130/70 | HR 81 | Temp 98.1°F | Ht 70.0 in | Wt 267.0 lb

## 2020-03-25 DIAGNOSIS — R399 Unspecified symptoms and signs involving the genitourinary system: Secondary | ICD-10-CM | POA: Diagnosis not present

## 2020-03-25 DIAGNOSIS — E119 Type 2 diabetes mellitus without complications: Secondary | ICD-10-CM

## 2020-03-25 LAB — URINALYSIS, ROUTINE W REFLEX MICROSCOPIC
Bacteria, UA: NONE SEEN /HPF
Bilirubin Urine: NEGATIVE
Hgb urine dipstick: NEGATIVE
Hyaline Cast: NONE SEEN /LPF
Ketones, ur: NEGATIVE
Leukocytes,Ua: NEGATIVE
Nitrite: NEGATIVE
Protein, ur: NEGATIVE
RBC / HPF: NONE SEEN /HPF (ref 0–2)
Specific Gravity, Urine: 1.02 (ref 1.001–1.03)
pH: 6 (ref 5.0–8.0)

## 2020-03-25 MED ORDER — SILDENAFIL CITRATE 100 MG PO TABS: 50.0000 mg | ORAL_TABLET | Freq: Every day | ORAL | 11 refills | Status: DC | PRN

## 2020-03-25 MED ORDER — DOXAZOSIN MESYLATE 4 MG PO TABS: 4.0000 mg | ORAL_TABLET | Freq: Every day | ORAL | 2 refills | Status: DC

## 2020-03-25 NOTE — Progress Notes (Signed)
Subjective:    Patient ID: James Ponce, male    DOB: Mar 05, 1956, 64 y.o.   MRN: [id number]  I last saw the patient for a checkup in February.  At that time his hemoglobin A1c was 6.6.  He is on a combination of Metformin, Trulicity, and [place].  His blood pressure today is well controlled at 130/70.  He denies any chest pain shortness of breath or dyspnea on exertion.  He denies any polydipsia or blurry vision.  He denies any neuropathy in his feet.  However he does report weak urinary stream.  He reports increasing urinary frequency however he states that he has very little pressure.  He will also experience dribbling whenever he goes to the bathroom to void.  He does not feel like he is completely evacuating his bladder.  After he finishes voiding, he will have some post void dribbling.  This causes his penis to stay damp and leads to burning and irritation on the head of his penis.  Visual inspection today of the glans penis shows no evidence of a yeast infection or cellulitis.  He denies any dysuria or hematuria.  PSA was less than 1 in February Past Medical History:  Diagnosis Date  . Arthritis   . Diabetes mellitus type II, uncontrolled (HCC)   . Diverticulosis   . Hyperlipidemia   . Hypertension    No past surgical history on file. Current Outpatient Medications on File Prior to Visit  Medication Sig Dispense Refill  . aspirin EC 81 MG tablet Take 81 mg by mouth daily.    Marland Kitchen azelastine (ASTELIN) 0.1 % nasal spray INHALE 2 SPRAYS INTO EACH NOSTRIL TWICE A DAY 30 mL 3  . celecoxib (CELEBREX) 100 MG capsule TAKE 1 CAPSULE BY MOUTH EVERY DAY 90 capsule 1  . dapagliflozin propanediol (FARXIGA) 10 MG TABS tablet Take 10 mg by mouth daily before breakfast. 30 tablet 5  . doxycycline (VIBRA-TABS) 100 MG tablet Take 1 tablet (100 mg total) by mouth 2 (two) times daily. 14 tablet 0  . Dulaglutide 0.75 MG/0.5ML SOPN INJECT 1 PEN INTO THE SKIN ONCE A WEEK. 4 pen 3  . fish oil-omega-3 fatty acids  1000 MG capsule Take 1-2 g by mouth 2 (two) times daily. 2 capsules in morning, and 1 in evening, daily    . fluticasone (FLONASE) 50 MCG/ACT nasal spray SPRAY 2 SPRAYS INTO EACH NOSTRIL EVERY DAY 48 mL 2  . Glucosamine-Chondroitin (GLUCOSAMINE CHONDR COMPLEX PO) Take 1 tablet by mouth 2 (two) times daily.    Marland Kitchen glucose blood test strip Use to check blood sugar twice daily.*Dispense Contour next test strips* 100 each 12  . loratadine (CLARITIN) 10 MG tablet Take 1 tablet (10 mg total) by mouth daily. 30 tablet 11  . metFORMIN (GLUCOPHAGE) 1000 MG tablet TAKE 1 TABLET (1,000 MG TOTAL) BY MOUTH 2 (TWO) TIMES DAILY WITH A MEAL. 180 tablet 3  . Multiple Vitamin (MULTIVITAMIN) tablet Take 1 tablet by mouth daily.    . pioglitazone (ACTOS) 30 MG tablet TAKE 1 TABLET BY MOUTH EVERY DAY 90 tablet 2  . rosuvastatin (CRESTOR) 10 MG tablet TAKE 1 TABLET BY MOUTH EVERY DAY 90 tablet 2  . valsartan-hydrochlorothiazide (DIOVAN-HCT) 160-25 MG tablet TAKE 1 TABLET BY MOUTH EVERY DAY 90 tablet 1   No current facility-administered medications on file prior to visit.   Allergies  Allergen Reactions  . Lipitor [Atorvastatin] Other (See Comments)    Muscle cramps, cant walk, sluggish  . Sulfa Antibiotics  Itching and Rash   Social History   Socioeconomic History  . Marital status: Single    Spouse name: Not on file  . Number of children: Not on file  . Years of education: Not on file  . Highest education level: Not on file  Occupational History  . Not on file  Tobacco Use  . Smoking status: Never Smoker  . Smokeless tobacco: Current User    Types: Chew  Substance and Sexual Activity  . Alcohol use: No  . Drug use: No  . Sexual activity: Not on file  Other Topics Concern  . Not on file  Social History Narrative  . Not on file   Social Determinants of Health   Financial Resource Strain:   . Difficulty of Paying Living Expenses: Not on file  Food Insecurity:   . Worried About Programme researcher, broadcasting/film/video  in the Last Year: Not on file  . Ran Out of Food in the Last Year: Not on file  Transportation Needs:   . Lack of Transportation (Medical): Not on file  . Lack of Transportation (Non-Medical): Not on file  Physical Activity:   . Days of Exercise per Week: Not on file  . Minutes of Exercise per Session: Not on file  Stress:   . Feeling of Stress : Not on file  Social Connections:   . Frequency of Communication with Friends and Family: Not on file  . Frequency of Social Gatherings with Friends and Family: Not on file  . Attends Religious Services: Not on file  . Active Member of Clubs or Organizations: Not on file  . Attends Banker Meetings: Not on file  . Marital Status: Not on file  Intimate Partner Violence:   . Fear of Current or Ex-Partner: Not on file  . Emotionally Abused: Not on file  . Physically Abused: Not on file  . Sexually Abused: Not on file     Review of Systems  All other systems reviewed and are negative.      Objective:   Physical Exam Vitals reviewed.  Constitutional:      General: He is not in acute distress.    Appearance: Normal appearance. He is normal weight. He is not ill-appearing.  Cardiovascular:     Rate and Rhythm: Normal rate and regular rhythm.     Pulses: Normal pulses.     Heart sounds: Normal heart sounds. No murmur heard.  No friction rub. No gallop.   Pulmonary:     Effort: Pulmonary effort is normal. No respiratory distress.     Breath sounds: Normal breath sounds. No wheezing, rhonchi or rales.  Musculoskeletal:     Right lower leg: No edema.     Left lower leg: No edema.  Neurological:     Mental Status: He is alert.           Assessment & Plan:  Diabetes mellitus without complication (HCC) - Plan: Hemoglobin A1c, COMPLETE METABOLIC PANEL WITH GFR  Lower urinary tract symptoms (LUTS) - Plan: Urinalysis, Routine w reflex microscopic  Check hemoglobin A1c.  Goal A1c is less than 7.  I see no evidence of a  yeast infection or balanitis.  I will check a urinalysis to rule out a urinary tract infection.  However I believe most of his symptoms are lower urinary tract symptoms secondary to BPH or perhaps sphincter dysfunction.  Therefore I will start the patient on doxazosin 4 mg a day.  We will avoid Flomax given  his history of allergies to sulfa.  Recheck in 2 weeks.  Monitor for orthostatic hypotension.  If this medication works but he does develop hypotension we may need to hold hydrochlorothiazide.  PSA was just checked in February and was normal.  Other potential causes would be overactive bladder.  Patient also reports erectile dysfunction and would like to try Viagra 50 mg 30 to 60 minutes before sexual activity

## 2020-03-26 LAB — COMPLETE METABOLIC PANEL WITH GFR
AG Ratio: 1.7 (calc) (ref 1.0–2.5)
ALT: 29 U/L (ref 9–46)
AST: 20 U/L (ref 10–35)
Albumin: 4.5 g/dL (ref 3.6–5.1)
Alkaline phosphatase (APISO): 79 U/L (ref 35–144)
BUN: 17 mg/dL (ref 7–25)
CO2: 25 mmol/L (ref 20–32)
Calcium: 9.9 mg/dL (ref 8.6–10.3)
Chloride: 100 mmol/L (ref 98–110)
Creat: 0.81 mg/dL (ref 0.70–1.25)
GFR, Est African American: 110 mL/min/{1.73_m2} (ref >=60)
GFR, Est Non African American: 95 mL/min/{1.73_m2} (ref >=60)
Globulin: 2.7 g/dL (calc) (ref 1.9–3.7)
Glucose, Bld: 141 mg/dL — ABNORMAL HIGH (ref 65–99)
Potassium: 4.1 mmol/L (ref 3.5–5.3)
Sodium: 136 mmol/L (ref 135–146)
Total Bilirubin: 0.5 mg/dL (ref 0.2–1.2)
Total Protein: 7.2 g/dL (ref 6.1–8.1)

## 2020-03-26 LAB — HEMOGLOBIN A1C
Hgb A1c MFr Bld: 7.7 % of total Hgb — ABNORMAL HIGH (ref <5.7)
Mean Plasma Glucose: 174 (calc)
eAG (mmol/L): 9.7 (calc)

## 2020-03-29 ENCOUNTER — Other Ambulatory Visit: Payer: Self-pay | Admitting: Family Medicine

## 2020-04-23 ENCOUNTER — Other Ambulatory Visit: Payer: Self-pay | Admitting: Family Medicine

## 2020-04-29 ENCOUNTER — Other Ambulatory Visit: Payer: Self-pay | Admitting: Family Medicine

## 2020-05-05 ENCOUNTER — Other Ambulatory Visit: Payer: Self-pay | Admitting: Family Medicine

## 2020-05-22 ENCOUNTER — Other Ambulatory Visit: Payer: Self-pay | Admitting: Family Medicine

## 2020-05-24 ENCOUNTER — Ambulatory Visit (INDEPENDENT_AMBULATORY_CARE_PROVIDER_SITE_OTHER): Payer: BC Managed Care – PPO | Admitting: Family Medicine

## 2020-05-24 ENCOUNTER — Other Ambulatory Visit: Payer: Self-pay

## 2020-05-24 VITALS — BP 130/68 | HR 96 | Temp 97.5°F | Ht 70.0 in | Wt 258.0 lb

## 2020-05-24 DIAGNOSIS — R058 Other specified cough: Secondary | ICD-10-CM

## 2020-05-24 MED ORDER — HYDROCODONE-HOMATROPINE 5-1.5 MG/5ML PO SYRP
5.0000 mL | ORAL_SOLUTION | Freq: Three times a day (TID) | ORAL | 0 refills | Status: DC | PRN
Start: 2020-05-24 — End: 2021-04-01

## 2020-05-24 MED ORDER — PREDNISONE 20 MG PO TABS: ORAL_TABLET | ORAL | 0 refills | Status: DC

## 2020-05-24 NOTE — Progress Notes (Signed)
Subjective:    Patient ID: James Ponce, male    DOB: Apr 10, 1956, 64 y.o.   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  He has a history of chronic allergies and he uses Astelin and Flonase on a daily basis.  Over the last 2 weeks he has been coughing constantly.  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  This is constantly stirring up dust which enters the cabin he breathes in.  He denies any fever.  He denies any chest pain.  He denies any shortness of breath although he does report a hacking cough productive of brown mucus however I believe the brown color is due to the dust.  He has had his Covid shots.  He denies any sick contacts.  He also reports sinus congestion and rhinorrhea. Past Medical History:  Diagnosis Date  . Arthritis   . Diabetes mellitus type II, uncontrolled (HCC)   . Diverticulosis   . Hyperlipidemia   . Hypertension    No past surgical history on file. Current Outpatient Medications on File Prior to Visit  Medication Sig Dispense Refill  . aspirin EC 81 MG tablet Take 81 mg by mouth daily.    Marland Kitchen azelastine (ASTELIN) 0.1 % nasal spray INHALE 2 SPRAYS INTO EACH NOSTRIL TWICE A DAY 30 mL 1  . celecoxib (CELEBREX) 100 MG capsule TAKE 1 CAPSULE BY MOUTH EVERY DAY 90 capsule 1  . doxazosin (CARDURA) 4 MG tablet Take 1 tablet (4 mg total) by mouth daily. 30 tablet 2  . doxycycline (VIBRA-TABS) 100 MG tablet Take 1 tablet (100 mg total) by mouth 2 (two) times daily. 14 tablet 0  . Dulaglutide 0.75 MG/0.5ML SOPN INJECT 1 PEN INTO THE SKIN ONCE A WEEK. 4 pen 3  . FARXIGA 10 MG TABS tablet TAKE 1 TABLET BY MOUTH EVERY MORNING BEFORE BREAKFAST 30 tablet 5  . fish oil-omega-3 fatty acids 1000 MG capsule Take 1-2 g by mouth 2 (two) times daily. 2 capsules in morning, and 1 in evening, daily    . fluticasone (FLONASE) 50 MCG/ACT nasal spray SPRAY 2 SPRAYS INTO EACH NOSTRIL EVERY DAY 48 mL 2  . Glucosamine-Chondroitin  (GLUCOSAMINE CHONDR COMPLEX PO) Take 1 tablet by mouth 2 (two) times daily.    Marland Kitchen glucose blood test strip Use to check blood sugar twice daily.*Dispense Contour next test strips* 100 each 12  . loratadine (CLARITIN) 10 MG tablet Take 1 tablet (10 mg total) by mouth daily. 30 tablet 11  . metFORMIN (GLUCOPHAGE) 1000 MG tablet TAKE 1 TABLET (1,000 MG TOTAL) BY MOUTH 2 (TWO) TIMES DAILY WITH A MEAL. 180 tablet 3  . Multiple Vitamin (MULTIVITAMIN) tablet Take 1 tablet by mouth daily.    . pioglitazone (ACTOS) 30 MG tablet TAKE 1 TABLET BY MOUTH EVERY DAY 90 tablet 2  . rosuvastatin (CRESTOR) 10 MG tablet TAKE 1 TABLET BY MOUTH EVERY DAY 90 tablet 2  . sildenafil (VIAGRA) 100 MG tablet Take 0.5-1 tablets (50-100 mg total) by mouth daily as needed for erectile dysfunction. 5 tablet 11  . valsartan-hydrochlorothiazide (DIOVAN-HCT) 160-25 MG tablet TAKE 1 TABLET BY MOUTH EVERY DAY 90 tablet 1   No current facility-administered medications on file prior to visit.   Allergies  Allergen Reactions  . Lipitor [Atorvastatin] Other (See Comments)    Muscle cramps, cant walk, sluggish  . Sulfa Antibiotics Itching and Rash   Social History   Socioeconomic History  .  Marital status: Single    Spouse name: Not on file  . Number of children: Not on file  . Years of education: Not on file  . Highest education level: Not on file  Occupational History  . Not on file  Tobacco Use  . Smoking status: Never Smoker  . Smokeless tobacco: Current User    Types: Chew  Substance and Sexual Activity  . Alcohol use: No  . Drug use: No  . Sexual activity: Not on file  Other Topics Concern  . Not on file  Social History Narrative  . Not on file   Social Determinants of Health   Financial Resource Strain:   . Difficulty of Paying Living Expenses: Not on file  Food Insecurity:   . Worried About Programme researcher, broadcasting/film/video in the Last Year: Not on file  . Ran Out of Food in the Last Year: Not on file    Transportation Needs:   . Lack of Transportation (Medical): Not on file  . Lack of Transportation (Non-Medical): Not on file  Physical Activity:   . Days of Exercise per Week: Not on file  . Minutes of Exercise per Session: Not on file  Stress:   . Feeling of Stress : Not on file  Social Connections:   . Frequency of Communication with Friends and Family: Not on file  . Frequency of Social Gatherings with Friends and Family: Not on file  . Attends Religious Services: Not on file  . Active Member of Clubs or Organizations: Not on file  . Attends Banker Meetings: Not on file  . Marital Status: Not on file  Intimate Partner Violence:   . Fear of Current or Ex-Partner: Not on file  . Emotionally Abused: Not on file  . Physically Abused: Not on file  . Sexually Abused: Not on file     Review of Systems  All other systems reviewed and are negative.      Objective:   Physical Exam Vitals reviewed.  Constitutional:      General: He is not in acute distress.    Appearance: Normal appearance. He is normal weight. He is not ill-appearing.  Cardiovascular:     Rate and Rhythm: Normal rate and regular rhythm.     Pulses: Normal pulses.     Heart sounds: Normal heart sounds. No murmur heard.  No friction rub. No gallop.   Pulmonary:     Effort: Pulmonary effort is normal. No respiratory distress.     Breath sounds: Normal breath sounds. No wheezing, rhonchi or rales.  Musculoskeletal:     Right lower leg: No edema.     Left lower leg: No edema.  Neurological:     Mental Status: He is alert.           Assessment & Plan:  Allergic cough  I believe the patient's cough is due to occupational exposure to dust and mold and allergies.  He is trying Flonase and Astelin and over-the-counter cough medicine without any relief.  He does have occasional wheezing.  Therefore I recommended using a prednisone taper pack with Hycodan 1 teaspoon every 6 hours as needed for  cough.  If he develops fever or chest pain he is to recheck immediately.

## 2020-05-25 ENCOUNTER — Other Ambulatory Visit: Payer: Self-pay | Admitting: Family Medicine

## 2020-06-16 ENCOUNTER — Other Ambulatory Visit: Payer: Self-pay | Admitting: Family Medicine

## 2020-06-19 ENCOUNTER — Other Ambulatory Visit: Payer: Self-pay | Admitting: Family Medicine

## 2020-06-20 DIAGNOSIS — L905 Scar conditions and fibrosis of skin: Secondary | ICD-10-CM | POA: Diagnosis not present

## 2020-06-20 DIAGNOSIS — C441192 Basal cell carcinoma of skin of left lower eyelid, including canthus: Secondary | ICD-10-CM | POA: Diagnosis not present

## 2020-06-20 DIAGNOSIS — D0439 Carcinoma in situ of skin of other parts of face: Secondary | ICD-10-CM | POA: Diagnosis not present

## 2020-06-20 DIAGNOSIS — D225 Melanocytic nevi of trunk: Secondary | ICD-10-CM | POA: Diagnosis not present

## 2020-06-20 DIAGNOSIS — D044 Carcinoma in situ of skin of scalp and neck: Secondary | ICD-10-CM | POA: Diagnosis not present

## 2020-06-20 DIAGNOSIS — L570 Actinic keratosis: Secondary | ICD-10-CM | POA: Diagnosis not present

## 2020-06-20 DIAGNOSIS — D229 Melanocytic nevi, unspecified: Secondary | ICD-10-CM | POA: Diagnosis not present

## 2020-06-20 DIAGNOSIS — D485 Neoplasm of uncertain behavior of skin: Secondary | ICD-10-CM | POA: Diagnosis not present

## 2020-07-16 ENCOUNTER — Other Ambulatory Visit: Payer: Self-pay | Admitting: Family Medicine

## 2020-08-06 DIAGNOSIS — C441192 Basal cell carcinoma of skin of left lower eyelid, including canthus: Secondary | ICD-10-CM | POA: Diagnosis not present

## 2020-08-06 DIAGNOSIS — D0439 Carcinoma in situ of skin of other parts of face: Secondary | ICD-10-CM | POA: Diagnosis not present

## 2020-08-06 DIAGNOSIS — D485 Neoplasm of uncertain behavior of skin: Secondary | ICD-10-CM | POA: Diagnosis not present

## 2020-08-07 DIAGNOSIS — Z85828 Personal history of other malignant neoplasm of skin: Secondary | ICD-10-CM | POA: Diagnosis not present

## 2020-08-07 DIAGNOSIS — L570 Actinic keratosis: Secondary | ICD-10-CM | POA: Diagnosis not present

## 2020-08-07 DIAGNOSIS — L905 Scar conditions and fibrosis of skin: Secondary | ICD-10-CM | POA: Diagnosis not present

## 2020-08-07 DIAGNOSIS — L720 Epidermal cyst: Secondary | ICD-10-CM | POA: Diagnosis not present

## 2020-08-08 ENCOUNTER — Other Ambulatory Visit: Payer: Self-pay | Admitting: Family Medicine

## 2020-08-19 ENCOUNTER — Other Ambulatory Visit: Payer: Self-pay | Admitting: Family Medicine

## 2020-08-20 DIAGNOSIS — D0439 Carcinoma in situ of skin of other parts of face: Secondary | ICD-10-CM | POA: Diagnosis not present

## 2020-08-20 DIAGNOSIS — D044 Carcinoma in situ of skin of scalp and neck: Secondary | ICD-10-CM | POA: Diagnosis not present

## 2020-09-03 DIAGNOSIS — D0439 Carcinoma in situ of skin of other parts of face: Secondary | ICD-10-CM | POA: Diagnosis not present

## 2020-09-03 DIAGNOSIS — C44311 Basal cell carcinoma of skin of nose: Secondary | ICD-10-CM | POA: Diagnosis not present

## 2020-09-03 DIAGNOSIS — D485 Neoplasm of uncertain behavior of skin: Secondary | ICD-10-CM | POA: Diagnosis not present

## 2020-09-08 ENCOUNTER — Other Ambulatory Visit: Payer: Self-pay | Admitting: Family Medicine

## 2020-09-09 DIAGNOSIS — E119 Type 2 diabetes mellitus without complications: Secondary | ICD-10-CM | POA: Diagnosis not present

## 2020-09-09 LAB — HM DIABETES EYE EXAM

## 2020-09-17 DIAGNOSIS — D0439 Carcinoma in situ of skin of other parts of face: Secondary | ICD-10-CM | POA: Diagnosis not present

## 2020-09-17 DIAGNOSIS — C44311 Basal cell carcinoma of skin of nose: Secondary | ICD-10-CM | POA: Diagnosis not present

## 2020-10-07 ENCOUNTER — Other Ambulatory Visit: Payer: Self-pay | Admitting: *Deleted

## 2020-10-07 MED ORDER — VALSARTAN-HYDROCHLOROTHIAZIDE 160-25 MG PO TABS
1.0000 | ORAL_TABLET | Freq: Every day | ORAL | 1 refills | Status: DC
Start: 2020-10-07 — End: 2021-03-14

## 2020-10-07 MED ORDER — CELECOXIB 100 MG PO CAPS: ORAL_CAPSULE | ORAL | 1 refills | Status: DC

## 2020-10-12 ENCOUNTER — Other Ambulatory Visit: Payer: Self-pay | Admitting: Family Medicine

## 2020-10-14 ENCOUNTER — Other Ambulatory Visit: Payer: Self-pay | Admitting: *Deleted

## 2020-10-14 MED ORDER — DAPAGLIFLOZIN PROPANEDIOL 10 MG PO TABS: ORAL_TABLET | ORAL | 3 refills | Status: DC

## 2020-10-15 DIAGNOSIS — C44311 Basal cell carcinoma of skin of nose: Secondary | ICD-10-CM | POA: Diagnosis not present

## 2020-12-17 DIAGNOSIS — Z85828 Personal history of other malignant neoplasm of skin: Secondary | ICD-10-CM | POA: Diagnosis not present

## 2020-12-17 DIAGNOSIS — L905 Scar conditions and fibrosis of skin: Secondary | ICD-10-CM | POA: Diagnosis not present

## 2020-12-17 DIAGNOSIS — D485 Neoplasm of uncertain behavior of skin: Secondary | ICD-10-CM | POA: Diagnosis not present

## 2020-12-19 DIAGNOSIS — C44222 Squamous cell carcinoma of skin of right ear and external auricular canal: Secondary | ICD-10-CM | POA: Diagnosis not present

## 2020-12-19 DIAGNOSIS — D225 Melanocytic nevi of trunk: Secondary | ICD-10-CM | POA: Diagnosis not present

## 2020-12-19 DIAGNOSIS — L720 Epidermal cyst: Secondary | ICD-10-CM | POA: Diagnosis not present

## 2020-12-19 DIAGNOSIS — D485 Neoplasm of uncertain behavior of skin: Secondary | ICD-10-CM | POA: Diagnosis not present

## 2020-12-19 DIAGNOSIS — L905 Scar conditions and fibrosis of skin: Secondary | ICD-10-CM | POA: Diagnosis not present

## 2020-12-19 DIAGNOSIS — L570 Actinic keratosis: Secondary | ICD-10-CM | POA: Diagnosis not present

## 2020-12-19 DIAGNOSIS — Z85828 Personal history of other malignant neoplasm of skin: Secondary | ICD-10-CM | POA: Diagnosis not present

## 2020-12-19 DIAGNOSIS — C44629 Squamous cell carcinoma of skin of left upper limb, including shoulder: Secondary | ICD-10-CM | POA: Diagnosis not present

## 2020-12-30 DIAGNOSIS — D0462 Carcinoma in situ of skin of left upper limb, including shoulder: Secondary | ICD-10-CM | POA: Diagnosis not present

## 2020-12-30 DIAGNOSIS — C44222 Squamous cell carcinoma of skin of right ear and external auricular canal: Secondary | ICD-10-CM | POA: Diagnosis not present

## 2021-01-21 ENCOUNTER — Other Ambulatory Visit: Payer: Self-pay | Admitting: Family Medicine

## 2021-01-26 ENCOUNTER — Encounter: Payer: Self-pay | Admitting: Family Medicine

## 2021-01-27 ENCOUNTER — Encounter: Payer: Self-pay | Admitting: Family Medicine

## 2021-01-27 DIAGNOSIS — D0462 Carcinoma in situ of skin of left upper limb, including shoulder: Secondary | ICD-10-CM | POA: Diagnosis not present

## 2021-01-27 DIAGNOSIS — L570 Actinic keratosis: Secondary | ICD-10-CM | POA: Diagnosis not present

## 2021-01-27 MED ORDER — DAPAGLIFLOZIN PROPANEDIOL 10 MG PO TABS: ORAL_TABLET | ORAL | 3 refills | Status: DC

## 2021-02-04 ENCOUNTER — Other Ambulatory Visit: Payer: Self-pay | Admitting: Family Medicine

## 2021-03-12 ENCOUNTER — Other Ambulatory Visit: Payer: Self-pay | Admitting: Family Medicine

## 2021-03-19 ENCOUNTER — Other Ambulatory Visit: Payer: Self-pay | Admitting: *Deleted

## 2021-03-19 MED ORDER — AZELASTINE HCL 0.1 % NA SOLN: NASAL | 0 refills | Status: DC

## 2021-03-19 MED ORDER — METFORMIN HCL 1000 MG PO TABS: 1000.0000 mg | ORAL_TABLET | Freq: Two times a day (BID) | ORAL | 0 refills | Status: DC

## 2021-04-01 ENCOUNTER — Ambulatory Visit (INDEPENDENT_AMBULATORY_CARE_PROVIDER_SITE_OTHER): Payer: BC Managed Care – PPO | Admitting: Family Medicine

## 2021-04-01 ENCOUNTER — Other Ambulatory Visit: Payer: Self-pay

## 2021-04-01 VITALS — BP 152/72 | HR 87 | Temp 98.2°F | Wt 262.0 lb

## 2021-04-01 DIAGNOSIS — I10 Essential (primary) hypertension: Secondary | ICD-10-CM | POA: Diagnosis not present

## 2021-04-01 DIAGNOSIS — E7800 Pure hypercholesterolemia, unspecified: Secondary | ICD-10-CM | POA: Diagnosis not present

## 2021-04-01 DIAGNOSIS — Z23 Encounter for immunization: Secondary | ICD-10-CM | POA: Diagnosis not present

## 2021-04-01 DIAGNOSIS — Z125 Encounter for screening for malignant neoplasm of prostate: Secondary | ICD-10-CM

## 2021-04-01 DIAGNOSIS — E118 Type 2 diabetes mellitus with unspecified complications: Secondary | ICD-10-CM

## 2021-04-01 MED ORDER — VALSARTAN-HYDROCHLOROTHIAZIDE 320-25 MG PO TABS: 1.0000 | ORAL_TABLET | Freq: Every day | ORAL | 3 refills | Status: DC

## 2021-04-01 NOTE — Addendum Note (Signed)
Addended by: Phillips Odor on: 04/01/2021 09:17 AM   Modules accepted: Orders

## 2021-04-01 NOTE — Progress Notes (Signed)
Subjective:    Patient ID: James Ponce, male    DOB: 08/27/1955, 65 y.o.   MRN: [id number] Patient is here today for a follow-up on his diabetes.  His last A1c was 7.7.  He states that he is being consistent taking the Trulicity, the [place], and the metformin.  He is no longer on pioglitazone.  He is not regularly checking his sugars.  He denies any polyuria polydipsia or blurry vision.  He states that he seldom gets any burning or tingling in his feet.  Diabetic foot exam today is normal.  He denies any chest pain shortness of breath or dyspnea on exertion however he does report that he has been under more stress recently.  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  He believes this is why his blood pressure is elevated.  He is having frequent PVCs today on exam but they are asymptomatic.  He is due for a tetanus shot and he declines his flu shot today Past Medical History:  Diagnosis Date   Arthritis    Diabetes mellitus type II, uncontrolled (HCC)    Diverticulosis    Hyperlipidemia    Hypertension    No past surgical history on file. Current Outpatient Medications on File Prior to Visit  Medication Sig Dispense Refill   aspirin EC 81 MG tablet Take 81 mg by mouth daily.     azelastine (ASTELIN) 0.1 % nasal spray Use in each nostril as directed 30 mL 0   celecoxib (CELEBREX) 100 MG capsule TAKE 1 CAPSULE BY MOUTH EVERY DAY 90 capsule 1   dapagliflozin propanediol (FARXIGA) 10 MG TABS tablet TAKE 1 TABLET BY MOUTH EVERY MORNING BEFORE BREAKFAST 90 tablet 3   doxazosin (CARDURA) 4 MG tablet TAKE 1 TABLET (4 MG TOTAL) BY MOUTH DAILY. 90 tablet 3   doxycycline (VIBRA-TABS) 100 MG tablet Take 1 tablet (100 mg total) by mouth 2 (two) times daily. 14 tablet 0   fish oil-omega-3 fatty acids 1000 MG capsule Take 1-2 g by mouth 2 (two) times daily. 2 capsules in morning, and 1 in evening, daily     fluticasone (FLONASE) 50 MCG/ACT nasal spray SPRAY 2 SPRAYS INTO  EACH NOSTRIL EVERY DAY 48 mL 2   Glucosamine-Chondroitin (GLUCOSAMINE CHONDR COMPLEX PO) Take 1 tablet by mouth 2 (two) times daily.     glucose blood test strip Use to check blood sugar twice daily.*Dispense Contour next test strips* 100 each 12   HYDROcodone-homatropine (HYCODAN) 5-1.5 MG/5ML syrup Take 5 mLs by mouth every 8 (eight) hours as needed for cough. 120 mL 0   loratadine (CLARITIN) 10 MG tablet Take 1 tablet (10 mg total) by mouth daily. 30 tablet 11   metFORMIN (GLUCOPHAGE) 1000 MG tablet Take 1 tablet (1,000 mg total) by mouth 2 (two) times daily with a meal. 180 tablet 0   Multiple Vitamin (MULTIVITAMIN) tablet Take 1 tablet by mouth daily.     pioglitazone (ACTOS) 30 MG tablet TAKE 1 TABLET BY MOUTH EVERY DAY 90 tablet 2   predniSONE (DELTASONE) 20 MG tablet 3 tabs poqday 1-2, 2 tabs poqday 3-4, 1 tab poqday 5-6 12 tablet 0   rosuvastatin (CRESTOR) 10 MG tablet TAKE 1 TABLET BY MOUTH EVERY DAY 90 tablet 2   sildenafil (VIAGRA) 100 MG tablet Take 0.5-1 tablets (50-100 mg total) by mouth daily as needed for erectile dysfunction. 5 tablet 11   TRULICITY 0.75 MG/0.5ML SOPN INJECT 1 PEN INTO  THE SKIN ONCE A WEEK. 2 mL 7   valsartan-hydrochlorothiazide (DIOVAN-HCT) 160-25 MG tablet TAKE 1 TABLET BY MOUTH EVERY DAY 90 tablet 1   No current facility-administered medications on file prior to visit.   Allergies  Allergen Reactions   Lipitor [Atorvastatin] Other (See Comments)    Muscle cramps, cant walk, sluggish   Sulfa Antibiotics Itching and Rash   Social History   Socioeconomic History   Marital status: Single    Spouse name: Not on file   Number of children: Not on file   Years of education: Not on file   Highest education level: Not on file  Occupational History   Not on file  Tobacco Use   Smoking status: Never   Smokeless tobacco: Current    Types: Chew  Substance and Sexual Activity   Alcohol use: No   Drug use: No   Sexual activity: Not on file  Other Topics  Concern   Not on file  Social History Narrative   Not on file   Social Determinants of Health   Financial Resource Strain: Not on file  Food Insecurity: Not on file  Transportation Needs: Not on file  Physical Activity: Not on file  Stress: Not on file  Social Connections: Not on file  Intimate Partner Violence: Not on file     Review of Systems  All other systems reviewed and are negative.     Objective:   Physical Exam Vitals reviewed.  Constitutional:      General: He is not in acute distress.    Appearance: Normal appearance. He is normal weight. He is not ill-appearing.  Cardiovascular:     Rate and Rhythm: Normal rate and regular rhythm.     Pulses: Normal pulses.     Heart sounds: Normal heart sounds. No murmur heard.   No friction rub. No gallop.  Pulmonary:     Effort: Pulmonary effort is normal. No respiratory distress.     Breath sounds: Normal breath sounds. No wheezing, rhonchi or rales.  Musculoskeletal:     Right lower leg: No edema.     Left lower leg: No edema.  Neurological:     Mental Status: He is alert.          Assessment & Plan:  Controlled type 2 diabetes mellitus with complication, without long-term current use of insulin (HCC) - Plan: CBC with Differential/Platelet, COMPLETE METABOLIC PANEL WITH GFR, Hemoglobin A1c, Lipid panel, Microalbumin, urine  Prostate cancer screening - Plan: PSA  Benign essential HTN  Pure hypercholesterolemia Blood pressure is too high.  I will increase the valsartan component of his blood pressure medicine to 320 mg.  Check an A1c.  I would like to see his A1c below 7 and even 6.5 if possible.  Check a urine microalbumin.  I like to see this below 30.  Check a PSA to screen for prostate cancer.  Check an LDL cholesterol which I would like to see below 100.  He received his tetanus shot today.  He declines his flu shot

## 2021-04-02 LAB — HEMOGLOBIN A1C
Hgb A1c MFr Bld: 7.2 % of total Hgb — ABNORMAL HIGH (ref <5.7)
Mean Plasma Glucose: 160 mg/dL
eAG (mmol/L): 8.9 mmol/L

## 2021-04-02 LAB — CBC WITH DIFFERENTIAL/PLATELET
Absolute Monocytes: 439 cells/uL (ref 200–950)
Basophils Absolute: 61 cells/uL (ref 0–200)
Basophils Relative: 1.2 %
Eosinophils Absolute: 189 cells/uL (ref 15–500)
Eosinophils Relative: 3.7 %
HCT: 48 % (ref 38.5–50.0)
Hemoglobin: 16.1 g/dL (ref 13.2–17.1)
Lymphs Abs: 969 cells/uL (ref 850–3900)
MCH: 30.7 pg (ref 27.0–33.0)
MCHC: 33.5 g/dL (ref 32.0–36.0)
MCV: 91.6 fL (ref 80.0–100.0)
MPV: 10.1 fL (ref 7.5–12.5)
Monocytes Relative: 8.6 %
Neutro Abs: 3443 cells/uL (ref 1500–7800)
Neutrophils Relative %: 67.5 %
Platelets: 225 10*3/uL (ref 140–400)
RBC: 5.24 10*6/uL (ref 4.20–5.80)
RDW: 13.1 % (ref 11.0–15.0)
Total Lymphocyte: 19 %
WBC: 5.1 10*3/uL (ref 3.8–10.8)

## 2021-04-02 LAB — COMPLETE METABOLIC PANEL WITH GFR
AG Ratio: 1.8 (calc) (ref 1.0–2.5)
ALT: 33 U/L (ref 9–46)
AST: 27 U/L (ref 10–35)
Albumin: 4.5 g/dL (ref 3.6–5.1)
Alkaline phosphatase (APISO): 63 U/L (ref 35–144)
BUN: 16 mg/dL (ref 7–25)
CO2: 27 mmol/L (ref 20–32)
Calcium: 9.7 mg/dL (ref 8.6–10.3)
Chloride: 99 mmol/L (ref 98–110)
Creat: 0.99 mg/dL (ref 0.70–1.35)
Globulin: 2.5 g/dL (calc) (ref 1.9–3.7)
Glucose, Bld: 146 mg/dL — ABNORMAL HIGH (ref 65–99)
Potassium: 4.2 mmol/L (ref 3.5–5.3)
Sodium: 139 mmol/L (ref 135–146)
Total Bilirubin: 0.6 mg/dL (ref 0.2–1.2)
Total Protein: 7 g/dL (ref 6.1–8.1)
eGFR: 85 mL/min/{1.73_m2} (ref >=60)

## 2021-04-02 LAB — MICROALBUMIN, URINE: Microalb, Ur: 0.5 mg/dL

## 2021-04-02 LAB — PSA: PSA: 0.33 ng/mL (ref <=4.00)

## 2021-04-02 LAB — LIPID PANEL
Cholesterol: 105 mg/dL (ref <200)
HDL: 45 mg/dL (ref >=40)
LDL Cholesterol (Calc): 36 mg/dL (calc)
Non-HDL Cholesterol (Calc): 60 mg/dL (calc) (ref <130)
Total CHOL/HDL Ratio: 2.3 (calc) (ref <5.0)
Triglycerides: 158 mg/dL — ABNORMAL HIGH (ref <150)

## 2021-04-22 ENCOUNTER — Other Ambulatory Visit: Payer: Self-pay | Admitting: Family Medicine

## 2021-06-10 ENCOUNTER — Other Ambulatory Visit: Payer: Self-pay | Admitting: Family Medicine

## 2021-06-13 ENCOUNTER — Other Ambulatory Visit: Payer: Self-pay

## 2021-06-13 ENCOUNTER — Ambulatory Visit (INDEPENDENT_AMBULATORY_CARE_PROVIDER_SITE_OTHER): Payer: BC Managed Care – PPO

## 2021-06-13 DIAGNOSIS — Z23 Encounter for immunization: Secondary | ICD-10-CM

## 2021-06-16 ENCOUNTER — Other Ambulatory Visit: Payer: Self-pay | Admitting: Family Medicine

## 2021-06-16 MED ORDER — AMOXICILLIN-POT CLAVULANATE 875-125 MG PO TABS: 1.0000 | ORAL_TABLET | Freq: Two times a day (BID) | ORAL | 0 refills | Status: DC

## 2021-06-20 DIAGNOSIS — L300 Nummular dermatitis: Secondary | ICD-10-CM | POA: Diagnosis not present

## 2021-06-20 DIAGNOSIS — L821 Other seborrheic keratosis: Secondary | ICD-10-CM | POA: Diagnosis not present

## 2021-06-20 DIAGNOSIS — L814 Other melanin hyperpigmentation: Secondary | ICD-10-CM | POA: Diagnosis not present

## 2021-06-20 DIAGNOSIS — D225 Melanocytic nevi of trunk: Secondary | ICD-10-CM | POA: Diagnosis not present

## 2021-06-20 DIAGNOSIS — L570 Actinic keratosis: Secondary | ICD-10-CM | POA: Diagnosis not present

## 2021-06-23 DIAGNOSIS — D485 Neoplasm of uncertain behavior of skin: Secondary | ICD-10-CM | POA: Diagnosis not present

## 2021-06-23 DIAGNOSIS — L723 Sebaceous cyst: Secondary | ICD-10-CM | POA: Diagnosis not present

## 2021-06-23 DIAGNOSIS — Z789 Other specified health status: Secondary | ICD-10-CM | POA: Diagnosis not present

## 2021-06-23 DIAGNOSIS — R208 Other disturbances of skin sensation: Secondary | ICD-10-CM | POA: Diagnosis not present

## 2021-07-18 ENCOUNTER — Other Ambulatory Visit: Payer: Self-pay | Admitting: Family Medicine

## 2021-09-09 ENCOUNTER — Telehealth: Payer: Self-pay | Admitting: Family Medicine

## 2021-09-09 NOTE — Telephone Encounter (Signed)
Patient left voicemail to request call back; message not completely audible. Patient mentioned price of a medication going from $25 monthly to $800 monthly; name of medication not clear. Also stated he has a coupon. LMTRC.  ?

## 2021-09-09 NOTE — Telephone Encounter (Signed)
I attempted to reach out to the patient to discuss what medication he  was referring to having a high co pay. Patient did not answer and not voicemail setup.  ?James Ponce T Rilee James Ponce ? ?

## 2021-09-10 ENCOUNTER — Other Ambulatory Visit: Payer: Self-pay

## 2021-09-10 NOTE — Telephone Encounter (Signed)
Spoke with [facility] and they have no idea why patient's copay went up to $762. They recommend patient call his ins co for more information.  ? ?Spoke with patient and advised he needs to contact his ins co for more information. Patient states he has called once and was not given any answers. Advised him to speak with someone regarding his pharmacy benefits and to see why his copay increased or what other options they would recommend to replace Trulicity. Patient will call ins co and then let us know. Nothing further needed at this time.  ?

## 2021-09-10 NOTE — Telephone Encounter (Signed)
Patient called back to say he was asking about Trulicity.  ? ?Tried to call [facility] but they are closed for lunch. WCB ?

## 2021-09-11 DIAGNOSIS — H524 Presbyopia: Secondary | ICD-10-CM | POA: Diagnosis not present

## 2021-09-11 DIAGNOSIS — H35361 Drusen (degenerative) of macula, right eye: Secondary | ICD-10-CM | POA: Diagnosis not present

## 2021-09-11 LAB — HM DIABETES EYE EXAM

## 2021-09-14 ENCOUNTER — Encounter: Payer: Self-pay | Admitting: Family Medicine

## 2021-09-15 ENCOUNTER — Other Ambulatory Visit: Payer: Self-pay | Admitting: Family Medicine

## 2021-09-15 DIAGNOSIS — J320 Chronic maxillary sinusitis: Secondary | ICD-10-CM | POA: Diagnosis not present

## 2021-09-15 DIAGNOSIS — J069 Acute upper respiratory infection, unspecified: Secondary | ICD-10-CM | POA: Diagnosis not present

## 2021-09-15 MED ORDER — AZITHROMYCIN 250 MG PO TABS: ORAL_TABLET | ORAL | 0 refills | Status: DC

## 2021-10-03 ENCOUNTER — Other Ambulatory Visit: Payer: Self-pay

## 2021-10-03 ENCOUNTER — Other Ambulatory Visit: Payer: Self-pay | Admitting: Family Medicine

## 2021-10-18 ENCOUNTER — Other Ambulatory Visit: Payer: Self-pay | Admitting: Family Medicine

## 2021-11-13 ENCOUNTER — Other Ambulatory Visit: Payer: Self-pay | Admitting: Family Medicine

## 2021-11-13 NOTE — Telephone Encounter (Signed)
Pt has refill available. ?Requested Prescriptions  ?Pending Prescriptions Disp Refills  ?? Azelastine HCl 137 MCG/SPRAY SOLN [Pharmacy Med Name: AZELASTINE 0.1% (137 MCG) SPRY]  1  ?  Sig: USE IN EACH NOSTRIL AS DIRECTED  ?  ? Ear, Nose, and Throat: Nasal Preparations - Antiallergy Passed - 11/13/2021  8:34 AM  ?  ?  Passed - Valid encounter within last 12 months  ?  Recent Outpatient Visits   ?      ? 7 months ago Controlled type 2 diabetes mellitus with complication, without long-term current use of insulin (HCC)  ? [facility] Pickard, Priscille Heidelberg, MD  ? 1 year ago Allergic cough  ? [facility] Pickard, Priscille Heidelberg, MD  ? 1 year ago Diabetes mellitus without complication [facility])  ? [facility] Pickard, Priscille Heidelberg, MD  ? 1 year ago Inflamed sebaceous cyst  ? [facility] Donita Brooks, MD  ? 2 years ago Diabetes mellitus without complication [facility])  ? [facility] Pickard, Priscille Heidelberg, MD  ?  ?  ? ?  ?  ?  ? ? ?

## 2021-11-14 ENCOUNTER — Other Ambulatory Visit: Payer: Self-pay

## 2021-11-14 MED ORDER — AZELASTINE HCL 137 MCG/SPRAY NA SOLN: NASAL | 0 refills | Status: DC

## 2021-11-29 ENCOUNTER — Ambulatory Visit
Admission: EM | Admit: 2021-11-29 | Discharge: 2021-11-29 | Disposition: A | Discharge status: Home / Self Care | Payer: BC Managed Care – PPO | Attending: Family Medicine | Admitting: Family Medicine

## 2021-11-29 ENCOUNTER — Encounter: Payer: Self-pay | Admitting: Emergency Medicine

## 2021-11-29 DIAGNOSIS — J069 Acute upper respiratory infection, unspecified: Secondary | ICD-10-CM

## 2021-11-29 MED ORDER — AMOXICILLIN-POT CLAVULANATE 875-125 MG PO TABS: 1.0000 | ORAL_TABLET | Freq: Two times a day (BID) | ORAL | 0 refills | Status: AC

## 2021-11-29 MED ORDER — BENZONATATE 200 MG PO CAPS: 200.0000 mg | ORAL_CAPSULE | Freq: Three times a day (TID) | ORAL | 0 refills | Status: DC | PRN

## 2021-11-29 NOTE — ED Provider Notes (Signed)
James Ponce    CSN: [id number] Arrival date & time: 11/29/21  1438      History   Chief Complaint No chief complaint on file.   HPI James Ponce is a 66 y.o. male.   HPI  Past Medical History:  Diagnosis Date   Arthritis    Diabetes mellitus type II, uncontrolled ([place])    Diverticulosis    Hyperlipidemia    Hypertension     Patient Active Problem List   Diagnosis Date Noted   Diabetes mellitus type II, uncontrolled    Hypertension    Hyperlipidemia    Arthritis    Prediabetes     No past surgical history on file.     Home Medications    Prior to Admission medications   Medication Sig Start Date End Date Taking? Authorizing Provider  aspirin EC 81 MG tablet Take 81 mg by mouth daily.    [provider]  Azelastine HCl 137 MCG/SPRAY SOLN USE IN EACH NOSTRIL AS DIRECTED 11/14/21   Susy Frizzle, MD  celecoxib (CELEBREX) 100 MG capsule TAKE 1 CAPSULE BY MOUTH EVERY DAY 10/03/21   Susy Frizzle, MD  dapagliflozin propanediol (FARXIGA) 10 MG TABS tablet TAKE 1 TABLET BY MOUTH EVERY MORNING BEFORE BREAKFAST 01/27/21   Susy Frizzle, MD  doxazosin (CARDURA) 4 MG tablet TAKE 1 TABLET EVERY DAY 06/10/21   Susy Frizzle, MD  fish oil-omega-3 fatty acids 1000 MG capsule Take 1-2 g by mouth 2 (two) times daily. 2 capsules in morning, and 1 in evening, daily    [provider]  fluticasone (FLONASE) 50 MCG/ACT nasal spray SPRAY 2 SPRAYS INTO EACH NOSTRIL EVERY DAY 11/06/19   Susy Frizzle, MD  Glucosamine-Chondroitin (GLUCOSAMINE CHONDR COMPLEX PO) Take 1 tablet by mouth 2 (two) times daily.    [provider]  glucose blood test strip Use to check blood sugar twice daily.*Dispense Contour next test strips* 04/05/17   Susy Frizzle, MD  loratadine (CLARITIN) 10 MG tablet Take 1 tablet (10 mg total) by mouth daily. 12/07/13   Susy Frizzle, MD  metFORMIN (GLUCOPHAGE) 1000 MG tablet TAKE 1 TABLET (1,000 MG TOTAL) BY  MOUTH 2 (TWO) TIMES DAILY WITH A MEAL 07/18/21   Susy Frizzle, MD  Multiple Vitamin (MULTIVITAMIN) tablet Take 1 tablet by mouth daily.    [provider]  rosuvastatin (CRESTOR) 10 MG tablet TAKE 1 TABLET BY MOUTH EVERY DAY 10/03/21   Susy Frizzle, MD  sildenafil (VIAGRA) 100 MG tablet TAKE 0.5-1 TABLETS (50-100 MG TOTAL) BY MOUTH DAILY AS NEEDED FOR ERECTILE DYSFUNCTION. 04/22/21   Susy Frizzle, MD  TRULICITY [id number] SOPN INJECT 1 PEN INTO THE SKIN ONCE A WEEK. 04/22/21   Susy Frizzle, MD  valsartan-hydrochlorothiazide (DIOVAN-HCT) 320-25 MG tablet Take 1 tablet by mouth daily. 04/01/21   Susy Frizzle, MD    Family History No family history on file.  Social History Social History   Tobacco Use   Smoking status: Never   Smokeless tobacco: Current    Types: Chew  Substance Use Topics   Alcohol use: No   Drug use: No     Allergies   Lipitor [atorvastatin] and Sulfa antibiotics   Review of Systems Review of Systems   Physical Exam Triage Vital Signs ED Triage Vitals [11/29/21 1528]  Enc Vitals Group     BP (!) 144/68     Pulse Rate 96     Resp 18  Temp 98.4 F (36.9 C)     Temp Source Oral     SpO2 95 %     Weight      Height      Head Circumference      Peak Flow      Pain Score      Pain Loc      Pain Edu?      Excl. in [place]?    No data found.  Updated Vital Signs BP (!) 144/68 (BP Location: Left Arm)   Pulse 96   Temp 98.4 F (36.9 C) (Oral)   Resp 18   SpO2 95%   Visual Acuity Right Eye Distance:   Left Eye Distance:   Bilateral Distance:    Right Eye Near:   Left Eye Near:    Bilateral Near:     Physical Exam   UC Treatments / Results  Labs (all labs ordered are listed, but only abnormal results are displayed) Labs Reviewed - No data to display  EKG   Radiology No results found.  Procedures Procedures (including critical care time)  Medications Ordered in UC Medications - No data to  display  Initial Impression / Assessment and Plan / UC Course  I have reviewed the triage vital signs and the nursing notes.  Pertinent labs & imaging results that were available during my care of the patient were reviewed by me and considered in my medical decision making (see chart for details).     *** Final Clinical Impressions(s) / UC Diagnoses   Final diagnoses:  None   Discharge Instructions   None    ED Prescriptions   None    PDMP not reviewed this encounter.

## 2021-11-29 NOTE — ED Triage Notes (Signed)
Pt presents with cough, nasal congestion x 5 days

## 2021-11-29 NOTE — Discharge Instructions (Signed)
Symptoms do not readily improve after completion of medication follow-up for evaluation.  Continue your regular allergy regimen with use of your Astelin nasal spray, Claritin, and Flonase.

## 2021-12-03 ENCOUNTER — Other Ambulatory Visit: Payer: Self-pay | Admitting: Family Medicine

## 2021-12-03 NOTE — Telephone Encounter (Signed)
Appointment made. Requested Prescriptions  Pending Prescriptions Disp Refills  . TRULICITY 0.75 MG/0.5ML SOPN [Pharmacy Med Name: TRULICITY 0.75 MG/0.5 ML PEN] 2 mL 1    Sig: INJECT 1 PEN INTO THE SKIN ONCE A WEEK.     Endocrinology:  Diabetes - GLP-1 Receptor Agonists Failed - 12/03/2021  1:35 AM      Failed - HBA1C is between 0 and 7.9 and within 180 days    Hgb A1C (fingerstick)  Date Value Ref Range Status  12/28/2014 6.7 (H) <5.7 % Final    Comment:                                                                           According to the ADA Clinical Practice Recommendations for 2011, when HbA1c is used as a screening test:     >=6.5%   Diagnostic of Diabetes Mellitus            (if abnormal result is confirmed)   5.7-6.4%   Increased risk of developing Diabetes Mellitus   References:Diagnosis and Classification of Diabetes Mellitus,Diabetes Care,2011,34(Suppl 1):S62-S69 and Standards of Medical Care in         Diabetes - 2011,Diabetes Care,2011,34 (Suppl 1):S11-S61.      Hgb A1c MFr Bld  Date Value Ref Range Status  04/01/2021 7.2 (H) <5.7 % of total Hgb Final    Comment:    For someone without known diabetes, a hemoglobin A1c value of 6.5% or greater indicates that they may have  diabetes and this should be confirmed with a follow-up  test. . For someone with known diabetes, a value <7% indicates  that their diabetes is well controlled and a value  greater than or equal to 7% indicates suboptimal  control. A1c targets should be individualized based on  duration of diabetes, age, comorbid conditions, and  other considerations. . Currently, no consensus exists regarding use of hemoglobin A1c for diagnosis of diabetes for children. .          Failed - Valid encounter within last 6 months    Recent Outpatient Visits          8 months ago Controlled type 2 diabetes mellitus with complication, without long-term current use of insulin (HCC)   [facility] Pickard, Priscille Heidelberg, MD   1 year ago Allergic cough   [facility] Tanya Nones Priscille Heidelberg, MD   1 year ago Diabetes mellitus without complication [facility])   [facility] Pickard, Priscille Heidelberg, MD   1 year ago Inflamed sebaceous cyst   [facility] Donita Brooks, MD   2 years ago Diabetes mellitus without complication [facility])   [facility] Pickard, Priscille Heidelberg, MD      Future Appointments            In 1 week Pickard, Priscille Heidelberg, MD [facility], [facility]

## 2021-12-04 NOTE — Telephone Encounter (Signed)
Scheduled pt. 6 month follow up appointment for 12/16/21 with PCP.

## 2021-12-16 ENCOUNTER — Encounter: Payer: Self-pay | Admitting: Family Medicine

## 2021-12-16 ENCOUNTER — Ambulatory Visit (INDEPENDENT_AMBULATORY_CARE_PROVIDER_SITE_OTHER): Payer: BC Managed Care – PPO | Admitting: Family Medicine

## 2021-12-16 VITALS — BP 142/88 | HR 82 | Ht 70.0 in | Wt 252.5 lb

## 2021-12-16 DIAGNOSIS — E118 Type 2 diabetes mellitus with unspecified complications: Secondary | ICD-10-CM

## 2021-12-16 DIAGNOSIS — I10 Essential (primary) hypertension: Secondary | ICD-10-CM | POA: Diagnosis not present

## 2021-12-16 DIAGNOSIS — Z23 Encounter for immunization: Secondary | ICD-10-CM

## 2021-12-16 DIAGNOSIS — E7800 Pure hypercholesterolemia, unspecified: Secondary | ICD-10-CM | POA: Diagnosis not present

## 2021-12-16 NOTE — Progress Notes (Signed)
Subjective:    Patient ID: James Ponce, male    DOB: 08/29/1955, 66 y.o.   MRN: [id number] Patient is here today for checkup.  He is due to recheck his A1c.  He has not been checking his blood sugars due to a problem with his meter.  He occasionally has diarrhea from the metformin but otherwise he is taking his medicine as prescribed.  He denies any neuropathy in his feet.  He denies any polyuria polydipsia or blurry vision.  He denies any chest pain or shortness of breath.  Today on examination he is having occasional PVCs every fifth or 6 heartbeat.  He is not feeling this.  He had a colonoscopy last year.  His PSA was checked in September and was normal.  He is due for Prevnar 20.  Diabetic foot exam was performed today and is normal.  Past Medical History:  Diagnosis Date   Arthritis    Diabetes mellitus type II, uncontrolled    Diverticulosis    Hyperlipidemia    Hypertension    No past surgical history on file. Current Outpatient Medications on File Prior to Visit  Medication Sig Dispense Refill   aspirin EC 81 MG tablet Take 81 mg by mouth daily.     Azelastine HCl 137 MCG/SPRAY SOLN USE IN EACH NOSTRIL AS DIRECTED 30 mL 0   benzonatate (TESSALON) 200 MG capsule Take 1 capsule (200 mg total) by mouth 3 (three) times daily as needed for cough. 20 capsule 0   celecoxib (CELEBREX) 100 MG capsule TAKE 1 CAPSULE BY MOUTH EVERY DAY 90 capsule 1   dapagliflozin propanediol (FARXIGA) 10 MG TABS tablet TAKE 1 TABLET BY MOUTH EVERY MORNING BEFORE BREAKFAST 90 tablet 3   doxazosin (CARDURA) 4 MG tablet TAKE 1 TABLET EVERY DAY 90 tablet 3   fish oil-omega-3 fatty acids 1000 MG capsule Take 1-2 g by mouth 2 (two) times daily. 2 capsules in morning, and 1 in evening, daily     fluticasone (FLONASE) 50 MCG/ACT nasal spray SPRAY 2 SPRAYS INTO EACH NOSTRIL EVERY DAY 48 mL 2   Glucosamine-Chondroitin (GLUCOSAMINE CHONDR COMPLEX PO) Take 1 tablet by mouth 2 (two) times daily.     glucose blood test  strip Use to check blood sugar twice daily.*Dispense Contour next test strips* 100 each 12   loratadine (CLARITIN) 10 MG tablet Take 1 tablet (10 mg total) by mouth daily. 30 tablet 11   metFORMIN (GLUCOPHAGE) 1000 MG tablet TAKE 1 TABLET (1,000 MG TOTAL) BY MOUTH 2 (TWO) TIMES DAILY WITH A MEAL 180 tablet 3   Multiple Vitamin (MULTIVITAMIN) tablet Take 1 tablet by mouth daily.     rosuvastatin (CRESTOR) 10 MG tablet TAKE 1 TABLET BY MOUTH EVERY DAY 90 tablet 1   sildenafil (VIAGRA) 100 MG tablet TAKE 0.5-1 TABLETS (50-100 MG TOTAL) BY MOUTH DAILY AS NEEDED FOR ERECTILE DYSFUNCTION. 4 tablet 14   TRULICITY 0.75 MG/0.5ML SOPN INJECT 1 PEN INTO THE SKIN ONCE A WEEK. 2 mL 1   valsartan-hydrochlorothiazide (DIOVAN-HCT) 320-25 MG tablet Take 1 tablet by mouth daily. 90 tablet 3   No current facility-administered medications on file prior to visit.   Allergies  Allergen Reactions   Lipitor [Atorvastatin] Other (See Comments)    Muscle cramps, cant walk, sluggish   Sulfa Antibiotics Itching and Rash   Social History   Socioeconomic History   Marital status: Single    Spouse name: Not on file   Number of children: Not on file  Years of education: Not on file   Highest education level: Not on file  Occupational History   Not on file  Tobacco Use   Smoking status: Never   Smokeless tobacco: Current    Types: Chew  Vaping Use   Vaping Use: Never used  Substance and Sexual Activity   Alcohol use: No   Drug use: No   Sexual activity: Not on file  Other Topics Concern   Not on file  Social History Narrative   Not on file   Social Determinants of Health   Financial Resource Strain: Not on file  Food Insecurity: Not on file  Transportation Needs: Not on file  Physical Activity: Not on file  Stress: Not on file  Social Connections: Not on file  Intimate Partner Violence: Not on file     Review of Systems  All other systems reviewed and are negative.      Objective:    Physical Exam Vitals reviewed.  Constitutional:      General: He is not in acute distress.    Appearance: Normal appearance. He is normal weight. He is not ill-appearing.  Cardiovascular:     Rate and Rhythm: Normal rate and regular rhythm.     Pulses: Normal pulses.     Heart sounds: Normal heart sounds. No murmur heard.    No friction rub. No gallop.  Pulmonary:     Effort: Pulmonary effort is normal. No respiratory distress.     Breath sounds: Normal breath sounds. No wheezing, rhonchi or rales.  Musculoskeletal:     Right lower leg: No edema.     Left lower leg: No edema.  Neurological:     Mental Status: He is alert.           Assessment & Plan:  Controlled type 2 diabetes mellitus with complication, without long-term current use of insulin (HCC) - Plan: Hemoglobin A1c, CBC with Differential/Platelet, Lipid panel, Microalbumin, urine, COMPLETE METABOLIC PANEL WITH GFR  Prostate cancer screening - Plan: PSA  Pure hypercholesterolemia  Benign essential HTN Physical exam is significant for occasional PVCs and borderline blood pressure.  The PVCs are asymptomatic.  Patient will start checking his blood pressure at home.  If consistently greater than 140/90 I would increase his doxazosin to 8 mg.  Check an A1c.  Goal A1c is less than 7.  Check a lipid panel.  Goal LDL cholesterol is less than 100.  Check a urine microalbumin.  Goal albumin to creatinine ratio is less than 30.  Patient received Prevnar 20.  Colonoscopy is up-to-date.  PSA is not due again until September.

## 2021-12-17 LAB — COMPLETE METABOLIC PANEL WITH GFR
AG Ratio: 1.6 (calc) (ref 1.0–2.5)
ALT: 27 U/L (ref 9–46)
AST: 21 U/L (ref 10–35)
Albumin: 4.4 g/dL (ref 3.6–5.1)
Alkaline phosphatase (APISO): 61 U/L (ref 35–144)
BUN: 15 mg/dL (ref 7–25)
CO2: 24 mmol/L (ref 20–32)
Calcium: 9.8 mg/dL (ref 8.6–10.3)
Chloride: 103 mmol/L (ref 98–110)
Creat: 1.03 mg/dL (ref 0.70–1.35)
Globulin: 2.8 g/dL (calc) (ref 1.9–3.7)
Glucose, Bld: 142 mg/dL — ABNORMAL HIGH (ref 65–99)
Potassium: 4.2 mmol/L (ref 3.5–5.3)
Sodium: 138 mmol/L (ref 135–146)
Total Bilirubin: 0.5 mg/dL (ref 0.2–1.2)
Total Protein: 7.2 g/dL (ref 6.1–8.1)
eGFR: 81 mL/min/{1.73_m2} (ref >=60)

## 2021-12-17 LAB — CBC WITH DIFFERENTIAL/PLATELET
Absolute Monocytes: 452 cells/uL (ref 200–950)
Basophils Absolute: 52 cells/uL (ref 0–200)
Basophils Relative: 1 %
Eosinophils Absolute: 130 cells/uL (ref 15–500)
Eosinophils Relative: 2.5 %
HCT: 49.2 % (ref 38.5–50.0)
Hemoglobin: 16.5 g/dL (ref 13.2–17.1)
Lymphs Abs: 1076 cells/uL (ref 850–3900)
MCH: 29.9 pg (ref 27.0–33.0)
MCHC: 33.5 g/dL (ref 32.0–36.0)
MCV: 89.1 fL (ref 80.0–100.0)
MPV: 9.8 fL (ref 7.5–12.5)
Monocytes Relative: 8.7 %
Neutro Abs: 3489 cells/uL (ref 1500–7800)
Neutrophils Relative %: 67.1 %
Platelets: 254 10*3/uL (ref 140–400)
RBC: 5.52 10*6/uL (ref 4.20–5.80)
RDW: 12.9 % (ref 11.0–15.0)
Total Lymphocyte: 20.7 %
WBC: 5.2 10*3/uL (ref 3.8–10.8)

## 2021-12-17 LAB — LIPID PANEL
Cholesterol: 107 mg/dL (ref <200)
HDL: 42 mg/dL (ref >=40)
LDL Cholesterol (Calc): 44 mg/dL (calc)
Non-HDL Cholesterol (Calc): 65 mg/dL (calc) (ref <130)
Total CHOL/HDL Ratio: 2.5 (calc) (ref <5.0)
Triglycerides: 129 mg/dL (ref <150)

## 2021-12-17 LAB — HEMOGLOBIN A1C
Hgb A1c MFr Bld: 7.1 % of total Hgb — ABNORMAL HIGH (ref <5.7)
Mean Plasma Glucose: 157 mg/dL
eAG (mmol/L): 8.7 mmol/L

## 2021-12-17 LAB — MICROALBUMIN, URINE: Microalb, Ur: 1.1 mg/dL

## 2021-12-18 ENCOUNTER — Other Ambulatory Visit: Payer: Self-pay

## 2021-12-18 MED ORDER — TRULICITY 1.5 MG/0.5ML ~~LOC~~ SOAJ: 1.5000 mg | SUBCUTANEOUS | 5 refills | Status: DC

## 2022-01-03 ENCOUNTER — Other Ambulatory Visit: Payer: Self-pay | Admitting: Family Medicine

## 2022-01-05 NOTE — Telephone Encounter (Signed)
Requested Prescriptions  Pending Prescriptions Disp Refills  . celecoxib (CELEBREX) 100 MG capsule [Pharmacy Med Name: CELECOXIB 100 MG CAPSULE] 90 capsule 1    Sig: TAKE 1 CAPSULE BY MOUTH EVERY DAY     Analgesics:  COX2 Inhibitors Failed - 01/03/2022  6:42 PM      Failed - Manual Review: Labs are only required if the patient has taken medication for more than 8 weeks.      Passed - HGB in normal range and within 360 days    Hemoglobin  Date Value Ref Range Status  12/16/2021 16.5 13.2 - 17.1 g/dL Final         Passed - Cr in normal range and within 360 days    Creat  Date Value Ref Range Status  12/16/2021 1.03 0.70 - 1.35 mg/dL Final         Passed - HCT in normal range and within 360 days    HCT  Date Value Ref Range Status  12/16/2021 49.2 38.5 - 50.0 % Final         Passed - AST in normal range and within 360 days    AST  Date Value Ref Range Status  12/16/2021 21 10 - 35 U/L Final         Passed - ALT in normal range and within 360 days    ALT  Date Value Ref Range Status  12/16/2021 27 9 - 46 U/L Final         Passed - eGFR is 30 or above and within 360 days    GFR, Est African American  Date Value Ref Range Status  03/25/2020 110 > OR = 60 mL/min/1.73m Final   GFR, Est Non African American  Date Value Ref Range Status  03/25/2020 95 > OR = 60 mL/min/1.750mFinal   eGFR  Date Value Ref Range Status  12/16/2021 81 > OR = 60 mL/min/1.7354minal    Comment:    The eGFR is based on the CKD-EPI 2021 equation. To calculate  the new eGFR from a previous Creatinine or Cystatin C result, go to https://www.kidney.org/professionals/ kdoqi/gfr%5Fcalculator          Passed - Patient is not pregnant      Passed - Valid encounter within last 12 months    Recent Outpatient Visits          9 months ago Controlled type 2 diabetes mellitus with complication, without long-term current use of insulin ([facility] [place] [place]   1  year ago Allergic cough   [place]   [facility] year [facility]   1 year ago Inflamed sebaceous cyst   [place]   2 years ago [facility] [place]ammie Mcgee[place]      Future Appointments            In 5 months [place], [place]

## 2022-01-09 ENCOUNTER — Other Ambulatory Visit: Payer: Self-pay | Admitting: Family Medicine

## 2022-01-09 NOTE — Telephone Encounter (Signed)
Requested Prescriptions  Pending Prescriptions Disp Refills  . FARXIGA 10 MG TABS tablet [Pharmacy Med Name: FARXIGA 10 MG TABLET] 90 tablet 3    Sig: TAKE 1 TABLET BY MOUTH EVERY DAY BEFORE BREAKFAST     Endocrinology:  Diabetes - SGLT2 Inhibitors Failed - 01/09/2022 12:44 PM      Failed - Valid encounter within last 6 months    Recent Outpatient Visits          9 months ago Controlled type 2 diabetes mellitus with complication, without long-term current use of insulin ([place])   [place] Pickard, Cammie Mcgee, MD   1 year ago Allergic cough   [place], Cammie Mcgee, MD   1 year ago Diabetes mellitus without complication ([place])   [place] Pickard, Cammie Mcgee, MD   1 year ago Inflamed sebaceous cyst   [place] Susy Frizzle, MD   2 years ago Diabetes mellitus without complication [facility])   [place] Pickard, Cammie Mcgee, MD      Future Appointments            In 5 months Pickard, Cammie Mcgee, MD [place], [facility]           Passed - Cr in normal range and within 360 days    Creat  Date Value Ref Range Status  12/16/2021 1.03 0.70 - 1.35 mg/dL Final         Passed - HBA1C is between 0 and 7.9 and within 180 days    Hgb A1C (fingerstick)  Date Value Ref Range Status  12/28/2014 6.7 (H) <5.7 % Final    Comment:                                                                           According to the ADA Clinical Practice Recommendations for 2011, when HbA1c is used as a screening test:     >=6.5%   Diagnostic of Diabetes Mellitus            (if abnormal result is confirmed)   5.7-6.4%   Increased risk of developing Diabetes Mellitus   References:Diagnosis and Classification of Diabetes Mellitus,Diabetes XVQM,0867,61(PJKDT 1):S62-S69 and Standards of Medical Care in         Diabetes - 2011,Diabetes Care,2011,34 (Suppl 1):S11-S61.      Hgb A1c MFr Bld  Date Value  Ref Range Status  12/16/2021 7.1 (H) <5.7 % of total Hgb Final    Comment:    For someone without known diabetes, a hemoglobin A1c value of 6.5% or greater indicates that they may have  diabetes and this should be confirmed with a follow-up  test. . For someone with known diabetes, a value <7% indicates  that their diabetes is well controlled and a value  greater than or equal to 7% indicates suboptimal  control. A1c targets should be individualized based on  duration of diabetes, age, comorbid conditions, and  other considerations. . Currently, no consensus exists regarding use of hemoglobin A1c for diagnosis of diabetes for children. .          Passed - eGFR in normal range and within 360 days  GFR, Est African American  Date Value Ref Range Status  03/25/2020 110 > OR = 60 mL/min/1.84m Final   GFR, Est Non African American  Date Value Ref Range Status  03/25/2020 95 > OR = 60 mL/min/1.742mFinal   eGFR  Date Value Ref Range Status  12/16/2021 81 > OR = 60 mL/min/1.7361minal    Comment:    The eGFR is based on the CKD-EPI 2021 equation. To calculate  the new eGFR from a previous Creatinine or Cystatin C result, go to https://www.kidney.org/professionals/ kdoqi/gfr%5Fcalculator

## 2022-01-21 DIAGNOSIS — L570 Actinic keratosis: Secondary | ICD-10-CM | POA: Diagnosis not present

## 2022-01-21 DIAGNOSIS — D225 Melanocytic nevi of trunk: Secondary | ICD-10-CM | POA: Diagnosis not present

## 2022-01-21 DIAGNOSIS — L821 Other seborrheic keratosis: Secondary | ICD-10-CM | POA: Diagnosis not present

## 2022-01-21 DIAGNOSIS — S80811A Abrasion, right lower leg, initial encounter: Secondary | ICD-10-CM | POA: Diagnosis not present

## 2022-01-21 DIAGNOSIS — L814 Other melanin hyperpigmentation: Secondary | ICD-10-CM | POA: Diagnosis not present

## 2022-02-26 ENCOUNTER — Encounter: Payer: Self-pay | Admitting: Family Medicine

## 2022-03-02 ENCOUNTER — Other Ambulatory Visit: Payer: Self-pay

## 2022-03-02 MED ORDER — VALSARTAN-HYDROCHLOROTHIAZIDE 320-25 MG PO TABS: 1.0000 | ORAL_TABLET | Freq: Every day | ORAL | 3 refills | Status: DC

## 2022-03-02 MED ORDER — ROSUVASTATIN CALCIUM 10 MG PO TABS: 10.0000 mg | ORAL_TABLET | Freq: Every day | ORAL | 1 refills | Status: DC

## 2022-03-02 MED ORDER — AZELASTINE HCL 137 MCG/SPRAY NA SOLN: NASAL | 0 refills | Status: DC

## 2022-03-02 MED ORDER — SILDENAFIL CITRATE 100 MG PO TABS
50.0000 mg | ORAL_TABLET | Freq: Every day | ORAL | 14 refills | Status: DC | PRN
Start: 2022-03-02 — End: 2023-07-09

## 2022-03-02 MED ORDER — METFORMIN HCL 1000 MG PO TABS: 1000.0000 mg | ORAL_TABLET | Freq: Two times a day (BID) | ORAL | 3 refills | Status: DC

## 2022-03-02 MED ORDER — CELECOXIB 100 MG PO CAPS: ORAL_CAPSULE | ORAL | 0 refills | Status: DC

## 2022-03-02 MED ORDER — DOXAZOSIN MESYLATE 4 MG PO TABS: 4.0000 mg | ORAL_TABLET | Freq: Every day | ORAL | 3 refills | Status: DC

## 2022-03-02 NOTE — Telephone Encounter (Signed)
Spoke w/pt's wife, requested to send re-order pt's meds to [facility].

## 2022-03-02 NOTE — Telephone Encounter (Signed)
Per pt's wife, meds have been transferred to [facility]

## 2022-03-12 ENCOUNTER — Encounter: Payer: Self-pay | Admitting: Family Medicine

## 2022-03-31 ENCOUNTER — Encounter: Payer: Self-pay | Admitting: Family Medicine

## 2022-04-05 ENCOUNTER — Other Ambulatory Visit: Payer: Self-pay | Admitting: Family Medicine

## 2022-04-06 ENCOUNTER — Telehealth: Payer: Self-pay | Admitting: Family Medicine

## 2022-04-06 ENCOUNTER — Other Ambulatory Visit: Payer: Self-pay

## 2022-04-06 DIAGNOSIS — E7800 Pure hypercholesterolemia, unspecified: Secondary | ICD-10-CM

## 2022-04-06 MED ORDER — ROSUVASTATIN CALCIUM 10 MG PO TABS: 10.0000 mg | ORAL_TABLET | Freq: Every day | ORAL | 3 refills | Status: DC

## 2022-04-06 NOTE — Telephone Encounter (Signed)
Refilled 03/02/2022 #[id number] rf. Requested Prescriptions  Pending Prescriptions Disp Refills  . valsartan-hydrochlorothiazide (DIOVAN-HCT) 320-25 MG tablet [Pharmacy Med Name: VALSARTAN-HCTZ 320-25 MG TAB] 90 tablet 3    Sig: TAKE 1 TABLET BY MOUTH EVERY DAY     Cardiovascular: ARB + Diuretic Combos Failed - 04/05/2022  1:47 AM      Failed - Last BP in normal range    BP Readings from Last 1 Encounters:  12/16/21 (!) 142/88         Failed - Valid encounter within last 6 months    Recent Outpatient Visits          1 year ago Controlled type 2 diabetes mellitus with complication, without long-term current use of insulin ([place])   [place] Pickard, Cammie Mcgee, MD   1 year ago Allergic cough   [place] Susy Frizzle, MD   2 years ago Diabetes mellitus without complication ([place])   [place] Susy Frizzle, MD   2 years ago Inflamed sebaceous cyst   [place] Susy Frizzle, MD   2 years ago Diabetes mellitus without complication ([place])   [place] Pickard, Cammie Mcgee, MD      Future Appointments            In 2 months Pickard, Cammie Mcgee, MD [place], [facility]           Passed - K in normal range and within 180 days    Potassium  Date Value Ref Range Status  12/16/2021 4.2 3.5 - 5.3 mmol/L Final         Passed - Na in normal range and within 180 days    Sodium  Date Value Ref Range Status  12/16/2021 138 135 - 146 mmol/L Final         Passed - Cr in normal range and within 180 days    Creat  Date Value Ref Range Status  12/16/2021 1.03 0.70 - 1.35 mg/dL Final         Passed - eGFR is 10 or above and within 180 days    GFR, Est African American  Date Value Ref Range Status  03/25/2020 110 > OR = 60 mL/min/1.71m Final   GFR, Est Non African American  Date Value Ref Range Status  03/25/2020 95 > OR = 60 mL/min/1.718mFinal   eGFR  Date Value Ref Range  Status  12/16/2021 81 > OR = 60 mL/min/1.7316minal    Comment:    The eGFR is based on the CKD-EPI 2021 equation. To calculate  the new eGFR from a previous Creatinine or Cystatin C result, go to https://www.kidney.org/professionals/ kdoqi/gfr%5Fcalculator          Passed - Patient is not pregnant

## 2022-04-06 NOTE — Telephone Encounter (Signed)
Received eFax from pharmacy to request refill or new script of   rosuvastatin (CRESTOR) 10 MG tablet   Pharmacy:   [id number] - [place], [address]  [place], [place] [address]  Phone:  [phone]  Fax:  [phone]  DEA #:  [id number]  LOV: 12/16/2021  Please advise pharmacist at [phone].

## 2022-05-17 ENCOUNTER — Other Ambulatory Visit: Payer: Self-pay | Admitting: Family Medicine

## 2022-05-18 NOTE — Telephone Encounter (Signed)
Requested Prescriptions  Pending Prescriptions Disp Refills   FARXIGA 10 MG TABS tablet [Pharmacy Med Name: FARXIGA 10MG TABLETS] 90 tablet 1    Sig: TAKE 1 TABLET BY MOUTH EVERY DAY BEFORE BREAKFAST     Endocrinology:  Diabetes - SGLT2 Inhibitors Failed - 05/17/2022  7:21 AM      Failed - Valid encounter within last 6 months    Recent Outpatient Visits           1 year ago Controlled type 2 diabetes mellitus with complication, without long-term current use of insulin ([place])   [place] Pickard, Cammie Mcgee, MD   1 year ago Allergic cough   [place] Susy Frizzle, MD   2 years ago Diabetes mellitus without complication ([place])   [place] Susy Frizzle, MD   2 years ago Inflamed sebaceous cyst   [place] Susy Frizzle, MD   2 years ago Diabetes mellitus without complication [facility])   [place] Pickard, Cammie Mcgee, MD       Future Appointments             In 2 weeks Pickard, Cammie Mcgee, MD [place], [place]   In 1 month Pickard, Cammie Mcgee, MD [place], [facility]            Passed - Cr in normal range and within 360 days    Creat  Date Value Ref Range Status  12/16/2021 1.03 0.70 - 1.35 mg/dL Final         Passed - HBA1C is between 0 and 7.9 and within 180 days    Hgb A1C (fingerstick)  Date Value Ref Range Status  12/28/2014 6.7 (H) <5.7 % Final    Comment:                                                                           According to the ADA Clinical Practice Recommendations for 2011, when HbA1c is used as a screening test:     >=6.5%   Diagnostic of Diabetes Mellitus            (if abnormal result is confirmed)   5.7-6.4%   Increased risk of developing Diabetes Mellitus   References:Diagnosis and Classification of Diabetes Mellitus,Diabetes EKCM,0349,17(HXTAV 1):S62-S69 and Standards of Medical Care in         Diabetes -  2011,Diabetes Care,2011,34 (Suppl 1):S11-S61.      Hgb A1c MFr Bld  Date Value Ref Range Status  12/16/2021 7.1 (H) <5.7 % of total Hgb Final    Comment:    For someone without known diabetes, a hemoglobin A1c value of 6.5% or greater indicates that they may have  diabetes and this should be confirmed with a follow-up  test. . For someone with known diabetes, a value <7% indicates  that their diabetes is well controlled and a value  greater than or equal to 7% indicates suboptimal  control. A1c targets should be individualized based on  duration of diabetes, age, comorbid conditions, and  other considerations. . Currently, no consensus exists regarding use of hemoglobin A1c for diagnosis of diabetes for children. Mar[place] Kitchen  Passed - eGFR in normal range and within 360 days    GFR, Est African American  Date Value Ref Range Status  03/25/2020 110 > OR = 60 mL/min/1.85m Final   GFR, Est Non African American  Date Value Ref Range Status  03/25/2020 95 > OR = 60 mL/min/1.71mFinal   eGFR  Date Value Ref Range Status  12/16/2021 81 > OR = 60 mL/min/1.7333minal    Comment:    The eGFR is based on the CKD-EPI 2021 equation. To calculate  the new eGFR from a previous Creatinine or Cystatin C result, go to https://www.kidney.org/professionals/ kdoqi/gfr%5Fcalculator

## 2022-06-03 ENCOUNTER — Other Ambulatory Visit: Payer: Self-pay

## 2022-06-03 ENCOUNTER — Ambulatory Visit (INDEPENDENT_AMBULATORY_CARE_PROVIDER_SITE_OTHER): Payer: Medicare Other | Admitting: Family Medicine

## 2022-06-03 VITALS — BP 124/68 | HR 90 | Ht 70.0 in | Wt 255.4 lb

## 2022-06-03 DIAGNOSIS — Z125 Encounter for screening for malignant neoplasm of prostate: Secondary | ICD-10-CM

## 2022-06-03 DIAGNOSIS — I10 Essential (primary) hypertension: Secondary | ICD-10-CM

## 2022-06-03 DIAGNOSIS — E118 Type 2 diabetes mellitus with unspecified complications: Secondary | ICD-10-CM

## 2022-06-03 DIAGNOSIS — E7800 Pure hypercholesterolemia, unspecified: Secondary | ICD-10-CM

## 2022-06-03 DIAGNOSIS — Z23 Encounter for immunization: Secondary | ICD-10-CM | POA: Diagnosis not present

## 2022-06-03 DIAGNOSIS — M1990 Unspecified osteoarthritis, unspecified site: Secondary | ICD-10-CM

## 2022-06-03 DIAGNOSIS — J302 Other seasonal allergic rhinitis: Secondary | ICD-10-CM

## 2022-06-03 DIAGNOSIS — R0989 Other specified symptoms and signs involving the circulatory and respiratory systems: Secondary | ICD-10-CM

## 2022-06-03 MED ORDER — OMEGA-3 FATTY ACIDS 1000 MG PO CAPS: 1.0000 g | ORAL_CAPSULE | Freq: Two times a day (BID) | ORAL | 3 refills | Status: DC

## 2022-06-03 MED ORDER — METFORMIN HCL 1000 MG PO TABS: 1000.0000 mg | ORAL_TABLET | Freq: Two times a day (BID) | ORAL | 3 refills | Status: DC

## 2022-06-03 MED ORDER — DOXAZOSIN MESYLATE 4 MG PO TABS: 4.0000 mg | ORAL_TABLET | Freq: Every day | ORAL | 3 refills | Status: DC

## 2022-06-03 MED ORDER — DAPAGLIFLOZIN PROPANEDIOL 10 MG PO TABS: ORAL_TABLET | ORAL | 3 refills | Status: DC

## 2022-06-03 MED ORDER — ROSUVASTATIN CALCIUM 10 MG PO TABS: 10.0000 mg | ORAL_TABLET | Freq: Every day | ORAL | 3 refills | Status: DC

## 2022-06-03 MED ORDER — VALSARTAN-HYDROCHLOROTHIAZIDE 320-25 MG PO TABS: 1.0000 | ORAL_TABLET | Freq: Every day | ORAL | 3 refills | Status: DC

## 2022-06-03 MED ORDER — CELECOXIB 100 MG PO CAPS: ORAL_CAPSULE | ORAL | 3 refills | Status: DC

## 2022-06-03 MED ORDER — TRULICITY 1.5 MG/0.5ML ~~LOC~~ SOAJ: 1.5000 mg | SUBCUTANEOUS | 3 refills | Status: DC

## 2022-06-03 MED ORDER — AZELASTINE HCL 137 MCG/SPRAY NA SOLN: NASAL | 3 refills | Status: DC

## 2022-06-03 NOTE — Progress Notes (Signed)
Subjective:    Patient ID: James Ponce, male    DOB: 1956/01/07, 66 y.o.   MRN: [id number] Patient is here today for checkup.  He is due to recheck his A1c.  While doing the patient's diabetic foot exam today I had a difficult time finding his dorsalis pedis pulse of his right foot.  He is completely asymptomatic.  He denies any claudication.  He has good capillary refill and good skin color.  However we discussed that he would like to get an ultrasound to evaluate for any peripheral artery disease.  He denies any neuropathy in his feet.  Only occasionally does he feel some burning.  He denies any numbness.  His blood pressure today is outstanding.  He would like to get his flu shot.  He declines the COVID shot and the RSV shot.  His shingles vaccine and his pneumonia shot are up-to-date.  His colonoscopy is not due for another 5 years.  He is due for a PSA to screen for prostate cancer.  Otherwise he is doing well with no concerns.  He does complain of diarrhea on the metformin and he would be willing to switch to glipizide depending upon his A1c results  Past Medical History:  Diagnosis Date   Arthritis    Diabetes mellitus type II, uncontrolled    Diverticulosis    Hyperlipidemia    Hypertension    No past surgical history on file. Current Outpatient Medications on File Prior to Visit  Medication Sig Dispense Refill   aspirin EC 81 MG tablet Take 81 mg by mouth daily.     Azelastine HCl 137 MCG/SPRAY SOLN USE IN EACH NOSTRIL AS DIRECTED 30 mL 0   celecoxib (CELEBREX) 100 MG capsule Per pt's wife , pt has been on this meds for a long time. 90 capsule 0   dapagliflozin propanediol (FARXIGA) 10 MG TABS tablet TAKE 1 TABLET BY MOUTH EVERY DAY BEFORE BREAKFAST 90 tablet 0   doxazosin (CARDURA) 4 MG tablet Take 1 tablet (4 mg total) by mouth daily. 90 tablet 3   Dulaglutide (TRULICITY) 1.5 MG/0.5ML SOPN Inject 1.5 mg into the skin once a week. 0.5 mL 5   fish oil-omega-3 fatty acids 1000 MG  capsule Take 1-2 g by mouth 2 (two) times daily. 2 capsules in morning, and 1 in evening, daily     fluticasone (FLONASE) 50 MCG/ACT nasal spray SPRAY 2 SPRAYS INTO EACH NOSTRIL EVERY DAY 48 mL 2   Glucosamine-Chondroitin (GLUCOSAMINE CHONDR COMPLEX PO) Take 1 tablet by mouth 2 (two) times daily.     glucose blood test strip Use to check blood sugar twice daily.*Dispense Contour next test strips* 100 each 12   loratadine (CLARITIN) 10 MG tablet Take 1 tablet (10 mg total) by mouth daily. 30 tablet 11   metFORMIN (GLUCOPHAGE) 1000 MG tablet Take 1 tablet (1,000 mg total) by mouth 2 (two) times daily with a meal. 180 tablet 3   Multiple Vitamin (MULTIVITAMIN) tablet Take 1 tablet by mouth daily.     rosuvastatin (CRESTOR) 10 MG tablet Take 1 tablet (10 mg total) by mouth daily. 90 tablet 3   sildenafil (VIAGRA) 100 MG tablet Take 0.5-1 tablets (50-100 mg total) by mouth daily as needed for erectile dysfunction. 4 tablet 14   valsartan-hydrochlorothiazide (DIOVAN-HCT) 320-25 MG tablet Take 1 tablet by mouth daily. 90 tablet 3   No current facility-administered medications on file prior to visit.   Allergies  Allergen Reactions   Lipitor [Atorvastatin] Other (  See Comments)    Muscle cramps, cant walk, sluggish   Sulfa Antibiotics Itching and Rash   Social History   Socioeconomic History   Marital status: Married    Spouse name: Not on file   Number of children: Not on file   Years of education: Not on file   Highest education level: Not on file  Occupational History   Not on file  Tobacco Use   Smoking status: Never   Smokeless tobacco: Current    Types: Chew  Vaping Use   Vaping Use: Never used  Substance and Sexual Activity   Alcohol use: No   Drug use: No   Sexual activity: Not on file  Other Topics Concern   Not on file  Social History Narrative   Not on file   Social Determinants of Health   Financial Resource Strain: Not on file  Food Insecurity: Not on file   Transportation Needs: Not on file  Physical Activity: Not on file  Stress: Not on file  Social Connections: Not on file  Intimate Partner Violence: Not on file     Review of Systems  All other systems reviewed and are negative.      Objective:   Physical Exam Vitals reviewed.  Constitutional:      General: He is not in acute distress.    Appearance: Normal appearance. He is normal weight. He is not ill-appearing.  Cardiovascular:     Rate and Rhythm: Normal rate and regular rhythm.     Pulses: Normal pulses.     Heart sounds: Normal heart sounds. No murmur heard.    No friction rub. No gallop.  Pulmonary:     Effort: Pulmonary effort is normal. No respiratory distress.     Breath sounds: Normal breath sounds. No wheezing, rhonchi or rales.  Musculoskeletal:     Right lower leg: No edema.     Left lower leg: No edema.  Neurological:     Mental Status: He is alert.           Assessment & Plan:  Controlled type 2 diabetes mellitus with complication, without long-term current use of insulin (HCC) - Plan: Hemoglobin A1c, CBC with Differential/Platelet, Lipid panel, COMPLETE METABOLIC PANEL WITH GFR, Protein / Creatinine Ratio, Urine  Benign essential HTN  Pure hypercholesterolemia  Prostate cancer screening - Plan: PSA  Decreased pedal pulses - Plan: VAS [place] ABI WITH/WO TBI I am very happy with his blood pressure today.  The PVCs that I auscultated last time but no longer present.  But I am concerned by his decreased pedal pulse in his right foot so I will get arterial Dopplers with an ABI to evaluate for any peripheral artery disease.  Hopefully, I am just having a difficult time finding the pulse.  He received his flu shot today.  He declines a COVID booster and an RSV vaccine.  We will screen for prostate cancer with a PSA.  I will also check his cholesterol.  His goal LDL cholesterol is less than 100 and his goal A1c is less than 6.5.  If his A1c is acceptable, we may  switch his metformin to glipizide due to diarrhea.

## 2022-06-04 LAB — COMPLETE METABOLIC PANEL WITH GFR
AG Ratio: 1.8 (calc) (ref 1.0–2.5)
ALT: 24 U/L (ref 9–46)
AST: 19 U/L (ref 10–35)
Albumin: 4.8 g/dL (ref 3.6–5.1)
Alkaline phosphatase (APISO): 75 U/L (ref 35–144)
BUN: 19 mg/dL (ref 7–25)
CO2: 23 mmol/L (ref 20–32)
Calcium: 10.3 mg/dL (ref 8.6–10.3)
Chloride: 102 mmol/L (ref 98–110)
Creat: 1.05 mg/dL (ref 0.70–1.35)
Globulin: 2.7 g/dL (calc) (ref 1.9–3.7)
Glucose, Bld: 156 mg/dL — ABNORMAL HIGH (ref 65–99)
Potassium: 4.1 mmol/L (ref 3.5–5.3)
Sodium: 138 mmol/L (ref 135–146)
Total Bilirubin: 0.6 mg/dL (ref 0.2–1.2)
Total Protein: 7.5 g/dL (ref 6.1–8.1)
eGFR: 79 mL/min/{1.73_m2} (ref >=60)

## 2022-06-04 LAB — CBC WITH DIFFERENTIAL/PLATELET
Absolute Monocytes: 440 cells/uL (ref 200–950)
Basophils Absolute: 62 cells/uL (ref 0–200)
Basophils Relative: 1 %
Eosinophils Absolute: 236 cells/uL (ref 15–500)
Eosinophils Relative: 3.8 %
HCT: 49.8 % (ref 38.5–50.0)
Hemoglobin: 17.5 g/dL — ABNORMAL HIGH (ref 13.2–17.1)
Lymphs Abs: 905 cells/uL (ref 850–3900)
MCH: 31.3 pg (ref 27.0–33.0)
MCHC: 35.1 g/dL (ref 32.0–36.0)
MCV: 88.9 fL (ref 80.0–100.0)
MPV: 9.8 fL (ref 7.5–12.5)
Monocytes Relative: 7.1 %
Neutro Abs: 4557 cells/uL (ref 1500–7800)
Neutrophils Relative %: 73.5 %
Platelets: 220 10*3/uL (ref 140–400)
RBC: 5.6 10*6/uL (ref 4.20–5.80)
RDW: 12.9 % (ref 11.0–15.0)
Total Lymphocyte: 14.6 %
WBC: 6.2 10*3/uL (ref 3.8–10.8)

## 2022-06-04 LAB — LIPID PANEL
Cholesterol: 122 mg/dL (ref <200)
HDL: 54 mg/dL (ref >=40)
LDL Cholesterol (Calc): 48 mg/dL (calc)
Non-HDL Cholesterol (Calc): 68 mg/dL (calc) (ref <130)
Total CHOL/HDL Ratio: 2.3 (calc) (ref <5.0)
Triglycerides: 118 mg/dL (ref <150)

## 2022-06-04 LAB — PSA: PSA: 0.39 ng/mL (ref <=4.00)

## 2022-06-04 LAB — HEMOGLOBIN A1C
Hgb A1c MFr Bld: 7 % of total Hgb — ABNORMAL HIGH (ref <5.7)
Mean Plasma Glucose: 154 mg/dL
eAG (mmol/L): 8.5 mmol/L

## 2022-06-04 LAB — PROTEIN / CREATININE RATIO, URINE
Creatinine, Urine: 84 mg/dL (ref 20–320)
Protein/Creat Ratio: 119 mg/g creat (ref 25–148)
Protein/Creatinine Ratio: 0.119 mg/mg creat (ref 0.025–0.148)
Total Protein, Urine: 10 mg/dL (ref 5–25)

## 2022-06-09 ENCOUNTER — Other Ambulatory Visit: Payer: Self-pay

## 2022-06-09 DIAGNOSIS — E118 Type 2 diabetes mellitus with unspecified complications: Secondary | ICD-10-CM

## 2022-06-09 MED ORDER — METFORMIN HCL ER 500 MG PO TB24: 2000.0000 mg | ORAL_TABLET | Freq: Every day | ORAL | 3 refills | Status: DC

## 2022-06-18 ENCOUNTER — Ambulatory Visit: Payer: BC Managed Care – PPO | Admitting: Family Medicine

## 2022-06-25 ENCOUNTER — Ambulatory Visit (HOSPITAL_COMMUNITY)
Admission: RE | Admit: 2022-06-25 | Discharge: 2022-06-25 | Disposition: A | Discharge status: Home / Self Care | Payer: Medicare Other | Source: Ambulatory Visit | Attending: Family Medicine | Admitting: Family Medicine

## 2022-06-25 DIAGNOSIS — R0989 Other specified symptoms and signs involving the circulatory and respiratory systems: Secondary | ICD-10-CM | POA: Diagnosis not present

## 2022-06-25 DIAGNOSIS — L300 Nummular dermatitis: Secondary | ICD-10-CM | POA: Diagnosis not present

## 2022-06-25 DIAGNOSIS — L821 Other seborrheic keratosis: Secondary | ICD-10-CM | POA: Diagnosis not present

## 2022-06-25 DIAGNOSIS — L814 Other melanin hyperpigmentation: Secondary | ICD-10-CM | POA: Diagnosis not present

## 2022-06-25 DIAGNOSIS — D225 Melanocytic nevi of trunk: Secondary | ICD-10-CM | POA: Diagnosis not present

## 2022-07-03 ENCOUNTER — Encounter: Payer: Self-pay | Admitting: Family Medicine

## 2022-07-03 ENCOUNTER — Telehealth: Payer: Self-pay

## 2022-07-03 NOTE — Telephone Encounter (Signed)
Pt received letter that [employer] will need a prior authorization after 07/06/2022.   Message sent to patient: Hi James Ponce,   It looks like James[facility] will need a prior authorization after January 1st. What we will need to do is send in a PA after they deny it in January. Usually, the pharmacy will notify us when the medication is denied so we can sent the PA in through Cover My Meds. Just let us know when you next refill the Trulicity and we will be on the look out for the denial. Thank you!  Germaine Pomfret

## 2022-07-09 DIAGNOSIS — K080 Exfoliation of teeth due to systemic causes: Secondary | ICD-10-CM | POA: Diagnosis not present

## 2022-07-25 ENCOUNTER — Encounter: Payer: Self-pay | Admitting: Family Medicine

## 2022-07-28 ENCOUNTER — Other Ambulatory Visit: Payer: Self-pay | Admitting: Family Medicine

## 2022-07-28 MED ORDER — SEMAGLUTIDE (1 MG/DOSE) 4 MG/3ML ~~LOC~~ SOPN: 1.0000 mg | PEN_INJECTOR | SUBCUTANEOUS | 5 refills | Status: DC

## 2022-07-31 ENCOUNTER — Ambulatory Visit
Admission: EM | Admit: 2022-07-31 | Discharge: 2022-07-31 | Disposition: A | Discharge status: Home / Self Care | Payer: Medicare Other | Attending: Emergency Medicine | Admitting: Emergency Medicine

## 2022-07-31 DIAGNOSIS — J069 Acute upper respiratory infection, unspecified: Secondary | ICD-10-CM

## 2022-07-31 MED ORDER — BENZONATATE 100 MG PO CAPS: 100.0000 mg | ORAL_CAPSULE | Freq: Three times a day (TID) | ORAL | 0 refills | Status: DC

## 2022-07-31 MED ORDER — PROMETHAZINE-DM 6.25-15 MG/5ML PO SYRP: 5.0000 mL | ORAL_SOLUTION | Freq: Four times a day (QID) | ORAL | 0 refills | Status: DC | PRN

## 2022-07-31 NOTE — Discharge Instructions (Addendum)
Your symptoms today are most likely being caused by a virus and should steadily improve in time it can take up to 7 to 10 days before you truly start to see a turnaround however things will get better  May use Tessalon pill every 8 hours to help calm your coughing  May use cough syrup every 6 hours as needed for additional comfort, be mindful this medication may make you feel drowsy      You can take Tylenol and/or Ibuprofen as needed for fever reduction and pain relief.   For cough: honey 1/2 to 1 teaspoon (you can dilute the honey in water or another fluid).  You can also use guaifenesin and dextromethorphan for cough. You can use a humidifier for chest congestion and cough.  If you don't have a humidifier, you can sit in the bathroom with the hot shower running.      For sore throat: try warm salt water gargles, cepacol lozenges, throat spray, warm tea or water with lemon/honey, popsicles or ice, or OTC cold relief medicine for throat discomfort.   For congestion: take a daily anti-histamine like Zyrtec, Claritin, and a oral decongestant, such as pseudoephedrine.  You can also use Flonase 1-2 sprays in each nostril daily.   It is important to stay hydrated: drink plenty of fluids (water, gatorade/powerade/pedialyte, juices, or teas) to keep your throat moisturized and help further relieve irritation/discomfort.

## 2022-07-31 NOTE — ED Triage Notes (Signed)
Pt reports coughing, head congestion, and chest congestion x 1 day. When he coughs he feels his lung ache

## 2022-07-31 NOTE — ED Provider Notes (Signed)
[facility]    CSN: [id number] Arrival date & time: 07/31/22  1224      History   Chief Complaint Chief Complaint  Patient presents with   Cough    Cheats congestion - Entered by patient    HPI James Ponce is a 67 y.o. male.   Patient presents for evaluation of nasal congestion, chest congestion, rhinorrhea, scratchy throat and a nonproductive cough beginning 1 day ago.  Feels as if there is a burning sensation to the throat and that his lungs occasionally aches.  No change in fatigue.  Known sick contact. Has similar symptoms.  Has attempted use of Tessalon which has been helpful.  Tolerating food and liquids.  Denies fever, chills, body aches.  Denies respiratory history.  Non-smoker.  Past Medical History:  Diagnosis Date   Arthritis    Diabetes mellitus type II, uncontrolled    Diverticulosis    Hyperlipidemia    Hypertension     Patient Active Problem List   Diagnosis Date Noted   Diabetes mellitus type II, uncontrolled    Hypertension    Hyperlipidemia    Arthritis    Prediabetes     History reviewed. No pertinent surgical history.     Home Medications    Prior to Admission medications   Medication Sig Start Date End Date Taking? Authorizing Provider  metFORMIN (GLUCOPHAGE-XR) 500 MG 24 hr tablet Take 4 tablets (2,000 mg total) by mouth daily with breakfast. 06/09/22   Susy Frizzle, MD  aspirin EC 81 MG tablet Take 81 mg by mouth daily.    [provider]  Azelastine HCl 137 MCG/SPRAY SOLN USE IN EACH NOSTRIL AS DIRECTED 06/03/22   Susy Frizzle, MD  celecoxib (CELEBREX) 100 MG capsule Per pt's wife , pt has been on this meds for a long time. 06/03/22   Susy Frizzle, MD  dapagliflozin propanediol (FARXIGA) 10 MG TABS tablet TAKE 1 TABLET BY MOUTH EVERY DAY BEFORE BREAKFAST 06/03/22   Susy Frizzle, MD  doxazosin (CARDURA) 4 MG tablet Take 1 tablet (4 mg total) by mouth daily. 06/03/22   Susy Frizzle, MD  fish  oil-omega-3 fatty acids 1000 MG capsule Take 1-2 capsules (1-2 g total) by mouth 2 (two) times daily. 2 capsules in morning, and 1 in evening, daily 06/03/22   Susy Frizzle, MD  fluticasone [facility]) 50 MCG/ACT nasal spray SPRAY 2 SPRAYS INTO EACH NOSTRIL EVERY DAY 11/06/19   Susy Frizzle, MD  Glucosamine-Chondroitin (GLUCOSAMINE CHONDR COMPLEX PO) Take 1 tablet by mouth 2 (two) times daily.    [provider]  glucose blood test strip Use to check blood sugar twice daily.*Dispense Contour next test strips* 04/05/17   Susy Frizzle, MD  loratadine (CLARITIN) 10 MG tablet Take 1 tablet (10 mg total) by mouth daily. 12/07/13   Susy Frizzle, MD  Multiple Vitamin (MULTIVITAMIN) tablet Take 1 tablet by mouth daily.    [provider]  rosuvastatin (CRESTOR) 10 MG tablet Take 1 tablet (10 mg total) by mouth daily. 06/03/22   Susy Frizzle, MD  Semaglutide, 1 MG/DOSE, 4 MG/3ML SOPN Inject 1 mg as directed once a week. 07/28/22   Susy Frizzle, MD  sildenafil (VIAGRA) 100 MG tablet Take 0.5-1 tablets (50-100 mg total) by mouth daily as needed for erectile dysfunction. Patient not taking: Reported on 06/03/2022 03/02/22   Susy Frizzle, MD  valsartan-hydrochlorothiazide (DIOVAN-HCT) 320-25 MG tablet Take 1 tablet by mouth daily.  06/03/22   Susy Frizzle, MD    Family History History reviewed. No pertinent family history.  Social History Social History   Tobacco Use   Smoking status: Never   Smokeless tobacco: Current    Types: Chew  Vaping Use   Vaping Use: Never used  Substance Use Topics   Alcohol use: No   Drug use: No     Allergies   Lipitor [atorvastatin] and Sulfa antibiotics   Review of Systems Review of Systems  Constitutional: Negative.   HENT:  Positive for congestion, rhinorrhea and sore throat. Negative for dental problem, drooling, ear discharge, ear pain, facial swelling, hearing loss, mouth sores, nosebleeds, postnasal drip,  sinus pressure, sinus pain, sneezing, tinnitus, trouble swallowing and voice change.   Eyes: Negative.   Respiratory:  Positive for cough. Negative for apnea, choking, chest tightness, shortness of breath, wheezing and stridor.   Cardiovascular: Negative.   Gastrointestinal: Negative.   Neurological: Negative.      Physical Exam Triage Vital Signs ED Triage Vitals  Enc Vitals Group     BP 07/31/22 1232 130/73     Pulse Rate 07/31/22 1232 (!) 113     Resp 07/31/22 1232 20     Temp 07/31/22 1232 98.1 F (36.7 C)     Temp Source 07/31/22 1232 Oral     SpO2 07/31/22 1232 94 %     Weight --      Height --      Head Circumference --      Peak Flow --      Pain Score 07/31/22 1233 0     Pain Loc --      Pain Edu? --      Excl. in [place]? --    No data found.  Updated Vital Signs BP 130/73 (BP Location: Right Arm)   Pulse (!) 113   Temp 98.1 F (36.7 C) (Oral)   Resp 20   SpO2 94%   Visual Acuity Right Eye Distance:   Left Eye Distance:   Bilateral Distance:    Right Eye Near:   Left Eye Near:    Bilateral Near:     Physical Exam Constitutional:      Appearance: Normal appearance.  HENT:     Head: Normocephalic.     Right Ear: Tympanic membrane, ear canal and external ear normal.     Left Ear: Tympanic membrane, ear canal and external ear normal.     Nose: Congestion and rhinorrhea present.     Mouth/Throat:     Mouth: Mucous membranes are moist.     Pharynx: Posterior oropharyngeal erythema present.  Cardiovascular:     Rate and Rhythm: Normal rate and regular rhythm.     Pulses: Normal pulses.     Heart sounds: Normal heart sounds.  Pulmonary:     Effort: Pulmonary effort is normal.     Breath sounds: Normal breath sounds.  Skin:    General: Skin is warm and dry.  Neurological:     Mental Status: He is alert and oriented to person, place, and time. Mental status is at baseline.      UC Treatments / Results  Labs (all labs ordered are listed, but only  abnormal results are displayed) Labs Reviewed - No data to display  EKG   Radiology No results found.  Procedures Procedures (including critical care time)  Medications Ordered in UC Medications - No data to display  Initial Impression / Assessment and Plan / UC  Course  I have reviewed the triage vital signs and the nursing notes.  Pertinent labs & imaging results that were available during my care of the patient were reviewed by me and considered in my medical decision making (see chart for details).  Viral URI with cough  Vital signs are stable, lungs are clear to auscultation, O2 saturation 94% on room air, low suspicion for pneumonia or bronchitis therefore imaging deferred, declined viral testing, will move forward with symptomatic treatment, prescribed Tessalon and Promethazine DM for comfort, recommended additional supportive measures, advised follow-up if symptoms persist or worsen Final Clinical Impressions(s) / UC Diagnoses   Final diagnoses:  None   Discharge Instructions   None    ED Prescriptions   None    PDMP not reviewed this encounter.   Valinda Hoar, NP 07/31/22 1255

## 2022-08-07 ENCOUNTER — Other Ambulatory Visit: Payer: Self-pay

## 2022-08-07 ENCOUNTER — Ambulatory Visit
Admission: EM | Admit: 2022-08-07 | Discharge: 2022-08-07 | Disposition: A | Discharge status: Home / Self Care | Payer: Medicare Other | Attending: Nurse Practitioner | Admitting: Nurse Practitioner

## 2022-08-07 ENCOUNTER — Ambulatory Visit (HOSPITAL_COMMUNITY)
Admission: RE | Admit: 2022-08-07 | Discharge: 2022-08-07 | Disposition: A | Discharge status: Home / Self Care | Payer: Medicare Other | Source: Ambulatory Visit | Attending: Nurse Practitioner | Admitting: Nurse Practitioner

## 2022-08-07 ENCOUNTER — Telehealth: Payer: Self-pay | Admitting: Nurse Practitioner

## 2022-08-07 ENCOUNTER — Encounter: Payer: Self-pay | Admitting: Emergency Medicine

## 2022-08-07 DIAGNOSIS — J22 Unspecified acute lower respiratory infection: Secondary | ICD-10-CM

## 2022-08-07 DIAGNOSIS — R079 Chest pain, unspecified: Secondary | ICD-10-CM | POA: Diagnosis not present

## 2022-08-07 DIAGNOSIS — R059 Cough, unspecified: Secondary | ICD-10-CM | POA: Diagnosis not present

## 2022-08-07 MED ORDER — AZITHROMYCIN 250 MG PO TABS: 250.0000 mg | ORAL_TABLET | Freq: Every day | ORAL | 0 refills | Status: DC

## 2022-08-07 MED ORDER — PSEUDOEPH-BROMPHEN-DM 30-2-10 MG/5ML PO SYRP: 5.0000 mL | ORAL_SOLUTION | Freq: Four times a day (QID) | ORAL | 0 refills | Status: DC | PRN

## 2022-08-07 MED ORDER — ALBUTEROL SULFATE (2.5 MG/3ML) 0.083% IN NEBU
2.5000 mg | INHALATION_SOLUTION | Freq: Once | RESPIRATORY_TRACT | Status: AC
  Administered 2022-08-07: 2.5 mg via RESPIRATORY_TRACT

## 2022-08-07 MED ORDER — ALBUTEROL SULFATE HFA 108 (90 BASE) MCG/ACT IN AERS: 1.0000 | INHALATION_SPRAY | Freq: Four times a day (QID) | RESPIRATORY_TRACT | 0 refills | Status: DC | PRN

## 2022-08-07 MED ORDER — PREDNISONE 20 MG PO TABS: 20.0000 mg | ORAL_TABLET | Freq: Every day | ORAL | 0 refills | Status: AC

## 2022-08-07 NOTE — ED Notes (Signed)
Post neb treatment, pt 02 saturation remained 91%. NAD noted.

## 2022-08-07 NOTE — ED Provider Notes (Signed)
[facility]    CSN: [id number] Arrival date & time: 08/07/22  2595      History   Chief Complaint Chief Complaint  Patient presents with   Cough    HPI James Ponce is a 67 y.o. male.   The history is provided by the patient.   The patient presents for continued cough, chest congestion, and headache.  Patient states symptoms worsened over the past 24 hours.  Patient was seen for the same or similar symptoms approximately 1 week ago and states that his symptoms are not getting any better.  He states this morning when he woke up, his head was "about to explode".  He states that he has cough that worsens with lying flat.  He states that the cough has kept him from sleeping.  He also states that he has some pain in his shoulders.  Patient was prescribed Promethazine DM and Tessalon for his symptoms last week.  States that he ran out of the Promethazine DM and that the [employer] did not help his cough.  Patient denies new fever, chills, wheezing, shortness of breath, or difficulty breathing.  Patient further denies any cardiac history or lung disease.  Room air sats were 88 to 91% during triage.  Past Medical History:  Diagnosis Date   Arthritis    Diabetes mellitus type II, uncontrolled    Diverticulosis    Hyperlipidemia    Hypertension     Patient Active Problem List   Diagnosis Date Noted   Diabetes mellitus type II, uncontrolled    Hypertension    Hyperlipidemia    Arthritis    Prediabetes     History reviewed. No pertinent surgical history.     Home Medications    Prior to Admission medications   Medication Sig Start Date End Date Taking? Authorizing Provider  albuterol (VENTOLIN HFA) 108 (90 Base) MCG/ACT inhaler Inhale 1-2 puffs into the lungs every 6 (six) hours as needed for wheezing or shortness of breath. 08/07/22  Yes Ingra Rother-Warren, Alda Lea, NP  azithromycin (ZITHROMAX) 250 MG tablet Take 1 tablet (250 mg total) by mouth daily. Take first  2 tablets together, then 1 every day until finished. 08/07/22  Yes Jaleigha Deane-Warren, Alda Lea, NP  brompheniramine-pseudoephedrine-DM 30-2-10 MG/5ML syrup Take 5 mLs by mouth 4 (four) times daily as needed. 08/07/22  Yes Carolanne Mercier-Warren, Alda Lea, NP  metFORMIN (GLUCOPHAGE-XR) 500 MG 24 hr tablet Take 4 tablets (2,000 mg total) by mouth daily with breakfast. 06/09/22   Susy Frizzle, MD  predniSONE (DELTASONE) 20 MG tablet Take 1 tablet (20 mg total) by mouth daily with breakfast for 5 days. 08/07/22 08/12/22 Yes Keyuna Cuthrell-Warren, Alda Lea, NP  aspirin EC 81 MG tablet Take 81 mg by mouth daily.    [provider]  Azelastine HCl 137 MCG/SPRAY SOLN USE IN EACH NOSTRIL AS DIRECTED 06/03/22   Susy Frizzle, MD  benzonatate (TESSALON) 100 MG capsule Take 1 capsule (100 mg total) by mouth every 8 (eight) hours. 07/31/22   Hans Eden, NP  celecoxib (CELEBREX) 100 MG capsule Per pt's wife , pt has been on this meds for a long time. 06/03/22   Susy Frizzle, MD  dapagliflozin propanediol (FARXIGA) 10 MG TABS tablet TAKE 1 TABLET BY MOUTH EVERY DAY BEFORE BREAKFAST 06/03/22   Susy Frizzle, MD  doxazosin (CARDURA) 4 MG tablet Take 1 tablet (4 mg total) by mouth daily. 06/03/22   Susy Frizzle, MD  fish oil-omega-3 fatty acids  1000 MG capsule Take 1-2 capsules (1-2 g total) by mouth 2 (two) times daily. 2 capsules in morning, and 1 in evening, daily 06/03/22   Susy Frizzle, MD  fluticasone [facility]) 50 MCG/ACT nasal spray SPRAY 2 SPRAYS INTO EACH NOSTRIL EVERY DAY 11/06/19   Susy Frizzle, MD  Glucosamine-Chondroitin (GLUCOSAMINE CHONDR COMPLEX PO) Take 1 tablet by mouth 2 (two) times daily.    [provider]  glucose blood test strip Use to check blood sugar twice daily.*Dispense Contour next test strips* 04/05/17   Susy Frizzle, MD  loratadine (CLARITIN) 10 MG tablet Take 1 tablet (10 mg total) by mouth daily. 12/07/13   Susy Frizzle, MD  Multiple Vitamin  (MULTIVITAMIN) tablet Take 1 tablet by mouth daily.    [provider]  promethazine-dextromethorphan (PROMETHAZINE-DM) 6.25-15 MG/5ML syrup Take 5 mLs by mouth 4 (four) times daily as needed for cough. 07/31/22   White, Leitha Schuller, NP  rosuvastatin (CRESTOR) 10 MG tablet Take 1 tablet (10 mg total) by mouth daily. 06/03/22   Susy Frizzle, MD  Semaglutide, 1 MG/DOSE, 4 MG/3ML SOPN Inject 1 mg as directed once a week. 07/28/22   Susy Frizzle, MD  sildenafil (VIAGRA) 100 MG tablet Take 0.5-1 tablets (50-100 mg total) by mouth daily as needed for erectile dysfunction. 03/02/22   Susy Frizzle, MD  valsartan-hydrochlorothiazide (DIOVAN-HCT) 320-25 MG tablet Take 1 tablet by mouth daily. 06/03/22   Susy Frizzle, MD    Family History History reviewed. No pertinent family history.  Social History Social History   Tobacco Use   Smoking status: Never   Smokeless tobacco: Current    Types: Chew  Vaping Use   Vaping Use: Never used  Substance Use Topics   Alcohol use: No   Drug use: No     Allergies   Lipitor [atorvastatin] and Sulfa antibiotics   Review of Systems Review of Systems Per HPI  Physical Exam Triage Vital Signs ED Triage Vitals  Enc Vitals Group     BP 08/07/22 1115 111/73     Pulse Rate 08/07/22 1115 96     Resp 08/07/22 1115 20     Temp 08/07/22 1115 97.9 F (36.6 C)     Temp Source 08/07/22 1115 Oral     SpO2 08/07/22 1115 91 %     Weight --      Height --      Head Circumference --      Peak Flow --      Pain Score 08/07/22 1113 6     Pain Loc --      Pain Edu? --      Excl. in [place]? --    No data found.  Updated Vital Signs BP 111/73 (BP Location: Right Arm)   Pulse 96   Temp 97.9 F (36.6 C) (Oral)   Resp 20   SpO2 91% Comment: denies shortness of breath.  Visual Acuity Right Eye Distance:   Left Eye Distance:   Bilateral Distance:    Right Eye Near:   Left Eye Near:    Bilateral Near:     Physical Exam Vitals  and nursing note reviewed.  Constitutional:      General: He is not in acute distress.    Appearance: Normal appearance.  HENT:     Head: Normocephalic.     Right Ear: Tympanic membrane, ear canal and external ear normal.     Left Ear: Tympanic membrane, ear canal and  external ear normal.     Nose: Congestion present.     Right Turbinates: Enlarged and swollen.     Left Turbinates: Enlarged and swollen.     Right Sinus: No maxillary sinus tenderness or frontal sinus tenderness.     Left Sinus: No frontal sinus tenderness.     Mouth/Throat:     Lips: Pink.     Mouth: Mucous membranes are moist.     Pharynx: Uvula midline. Posterior oropharyngeal erythema present. No pharyngeal swelling or oropharyngeal exudate.     Tonsils: No tonsillar exudate.  Eyes:     Extraocular Movements: Extraocular movements intact.     Pupils: Pupils are equal, round, and reactive to light.  Cardiovascular:     Rate and Rhythm: Normal rate and regular rhythm.     Pulses: Normal pulses.     Heart sounds: Normal heart sounds.  Pulmonary:     Effort: Pulmonary effort is normal. No tachypnea, bradypnea or respiratory distress.     Breath sounds: Decreased air movement present. Examination of the right-lower field reveals decreased breath sounds. Decreased breath sounds present. No rhonchi or rales.     Comments: Post nebulizer treatment, patient with faint expiratory wheezing noted in the posterior right lower lobe Abdominal:     General: Bowel sounds are normal.     Tenderness: There is no abdominal tenderness.  Musculoskeletal:     Cervical back: Normal range of motion.  Lymphadenopathy:     Cervical: No cervical adenopathy.  Skin:    General: Skin is warm and dry.  Neurological:     General: No focal deficit present.     Mental Status: He is alert and oriented to person, place, and time.  Psychiatric:        Mood and Affect: Mood normal.        Behavior: Behavior normal.     O2 sats were  rechecked after nebulizer treatment.  Sats ranged between 91 to 93%.  UC Treatments / Results  Labs (all labs ordered are listed, but only abnormal results are displayed) Labs Reviewed - No data to display  EKG   Radiology No results found.  Procedures Procedures (including critical care time)  Medications Ordered in UC Medications  albuterol (PROVENTIL) (2.5 MG/3ML) 0.083% nebulizer solution 2.5 mg (2.5 mg Nebulization Given 08/07/22 1143)    Initial Impression / Assessment and Plan / UC Course  I have reviewed the triage vital signs and the nursing notes.  Pertinent labs & imaging results that were available during my care of the patient were reviewed by me and considered in my medical decision making (see chart for details).  The patient is well-appearing, he is in no acute distress, his oxygen saturation is ranging between 88 to 91%, but is in no acute distress.  He is speaking in complete sentences.  Cannot rule out pneumonia at this time.  Unable to perform imaging in the clinic.  Will order outpatient chest x-ray to be completed.  In the interim, will start patient on azithromycin 250 mg, and albuterol inhaler for cough and shortness of breath, and Bromfed-DM for the cough.  Will also start patient on prednisone 20 mg for 5 days to help with wheezing.  Patient was advised he will be contacted when the chest x-ray result is available.  Discussed with patient strict indications of when follow-up in the emergency department will be necessary.  Also recommended that he follow-up with his PCP for further evaluation.  Advised patient that  due to the inability to perform imaging today, it is recommended that he go to the emergency department, patient declined, and agreed that he would follow-up with the chest x-ray.  Recommended that the patient follow-up in this clinic in 24 hours for reevaluation.  Patient was also in agreement with this plan.  Supportive care recommendations were  provided to the patient.  Patient verbalizes understanding.  All questions were answered.  Patient is stable for discharge. Final Clinical Impressions(s) / UC Diagnoses   Final diagnoses:  Lower respiratory infection     Discharge Instructions      Go to [facility] to have the chest x-ray performed.  Once the results are received, you will be contacted to discuss. Take medication as prescribed. Increase fluids and allow for plenty of rest. Recommend continuing over-the-counter Tylenol as needed for pain, fever, or general discomfort. Recommend you use a humidifier in your bedroom at nighttime during sleep and sleeping elevated on pillows while cough symptoms persist. If you develop shortness of breath, difficulty breathing, or become unable to speak in a complete sentence, please go to the emergency department immediately. As discussed, please follow-up with your primary care physician within the next week for reevaluation. Follow-up as needed.     ED Prescriptions     Medication Sig Dispense Auth. Provider   azithromycin (ZITHROMAX) 250 MG tablet Take 1 tablet (250 mg total) by mouth daily. Take first 2 tablets together, then 1 every day until finished. 6 tablet Fumio Vandam-Warren, Alda Lea, NP   albuterol (VENTOLIN HFA) 108 (90 Base) MCG/ACT inhaler Inhale 1-2 puffs into the lungs every 6 (six) hours as needed for wheezing or shortness of breath. 8 g Sweet Jarvis-Warren, Alda Lea, NP   brompheniramine-pseudoephedrine-DM 30-2-10 MG/5ML syrup Take 5 mLs by mouth 4 (four) times daily as needed. 140 mL Minami Arriaga-Warren, Alda Lea, NP   predniSONE (DELTASONE) 20 MG tablet Take 1 tablet (20 mg total) by mouth daily with breakfast for 5 days. 5 tablet Anwar Sakata-Warren, Alda Lea, NP      PDMP not reviewed this encounter.   Tish Men, NP 08/07/22 1252

## 2022-08-07 NOTE — ED Triage Notes (Signed)
Pt reports continued cough and chest congestion, insomnia. Pt reports seen for same last week and reports symptoms were getting better but reports flared up last night. NAD noted. Denies any known fevers. Son has similar symptoms.

## 2022-08-07 NOTE — ED Notes (Signed)
Re-checked pt o2 with portable pulse ox. 02 on room air ranging from 89-90%. NP aware. Pt continues to deny chest pain, shortness of breath,etc. Hands warm, cap refill WNL. NAD noted.

## 2022-08-07 NOTE — Discharge Instructions (Addendum)
Go to [facility] to have the chest x-ray performed.  Once the results are received, you will be contacted to discuss. Take medication as prescribed. Increase fluids and allow for plenty of rest. Recommend continuing over-the-counter Tylenol as needed for pain, fever, or general discomfort. Recommend you use a humidifier in your bedroom at nighttime during sleep and sleeping elevated on pillows while cough symptoms persist. If you develop shortness of breath, difficulty breathing, or become unable to speak in a complete sentence, please go to the emergency department immediately. As discussed, please follow-up with your primary care physician within the next week for reevaluation. Follow-up as needed.

## 2022-08-07 NOTE — Telephone Encounter (Signed)
Call patient regarding chest x-ray result.  Verified patient's date of birth.  Patient was advised of results of his chest x-ray.  Patient to continue his current medication regimen that was prescribed today.  Discussed with patient that it is recommended that he follow-up in this clinic on 08/08/2022 as needed.  Also reiterated strict ER precautions.  Patient advised that he does have an appointment with his PCP on 2/6.  Patient verbalizes understanding.  All questions were answered.

## 2022-08-11 ENCOUNTER — Ambulatory Visit (INDEPENDENT_AMBULATORY_CARE_PROVIDER_SITE_OTHER): Payer: Medicare Other | Admitting: Family Medicine

## 2022-08-11 ENCOUNTER — Encounter: Payer: Self-pay | Admitting: Family Medicine

## 2022-08-11 VITALS — BP 130/68 | HR 106 | Temp 97.7°F | Ht 70.0 in | Wt 249.0 lb

## 2022-08-11 DIAGNOSIS — J0110 Acute frontal sinusitis, unspecified: Secondary | ICD-10-CM

## 2022-08-11 MED ORDER — AMOXICILLIN-POT CLAVULANATE 875-125 MG PO TABS: 1.0000 | ORAL_TABLET | Freq: Two times a day (BID) | ORAL | 0 refills | Status: DC

## 2022-08-11 NOTE — Progress Notes (Signed)
Subjective:    Patient ID: James Ponce, male    DOB: [birth year], 67 y.o.   MRN: [id number] Patient has been sick for the last 2 weeks.  He went to urgent care where he was given a Z-Pak and prednisone 20 mg daily for 5 days.  He reports pain and pressure in his right frontal sinus, dizziness upon standing, lightheadedness, and head congestion.  He is slightly tachycardic and appears dehydrated today on exam Past Medical History:  Diagnosis Date   Arthritis    Diabetes mellitus type II, uncontrolled    Diverticulosis    Hyperlipidemia    Hypertension    No past surgical history on file. Current Outpatient Medications on File Prior to Visit  Medication Sig Dispense Refill   metFORMIN (GLUCOPHAGE-XR) 500 MG 24 hr tablet Take 4 tablets (2,000 mg total) by mouth daily with breakfast. 120 tablet 3   albuterol (VENTOLIN HFA) 108 (90 Base) MCG/ACT inhaler Inhale 1-2 puffs into the lungs every 6 (six) hours as needed for wheezing or shortness of breath. 8 g 0   aspirin EC 81 MG tablet Take 81 mg by mouth daily.     Azelastine HCl 137 MCG/SPRAY SOLN USE IN EACH NOSTRIL AS DIRECTED 30 mL 3   azithromycin (ZITHROMAX) 250 MG tablet Take 1 tablet (250 mg total) by mouth daily. Take first 2 tablets together, then 1 every day until finished. 6 tablet 0   benzonatate (TESSALON) 100 MG capsule Take 1 capsule (100 mg total) by mouth every 8 (eight) hours. 21 capsule 0   brompheniramine-pseudoephedrine-DM 30-2-10 MG/5ML syrup Take 5 mLs by mouth 4 (four) times daily as needed. 140 mL 0   celecoxib (CELEBREX) 100 MG capsule Per pt's wife , pt has been on this meds for a long time. 90 capsule 3   dapagliflozin propanediol (FARXIGA) 10 MG TABS tablet TAKE 1 TABLET BY MOUTH EVERY DAY BEFORE BREAKFAST 90 tablet 3   doxazosin (CARDURA) 4 MG tablet Take 1 tablet (4 mg total) by mouth daily. 90 tablet 3   fish oil-omega-3 fatty acids 1000 MG capsule Take 1-2 capsules (1-2 g total) by mouth 2 (two) times daily. 2  capsules in morning, and 1 in evening, daily 90 capsule 3   fluticasone (FLONASE) 50 MCG/ACT nasal spray SPRAY 2 SPRAYS INTO EACH NOSTRIL EVERY DAY 48 mL 2   Glucosamine-Chondroitin (GLUCOSAMINE CHONDR COMPLEX PO) Take 1 tablet by mouth 2 (two) times daily.     glucose blood test strip Use to check blood sugar twice daily.*Dispense Contour next test strips* 100 each 12   loratadine (CLARITIN) 10 MG tablet Take 1 tablet (10 mg total) by mouth daily. 30 tablet 11   Multiple Vitamin (MULTIVITAMIN) tablet Take 1 tablet by mouth daily.     predniSONE (DELTASONE) 20 MG tablet Take 1 tablet (20 mg total) by mouth daily with breakfast for 5 days. 5 tablet 0   promethazine-dextromethorphan (PROMETHAZINE-DM) 6.25-15 MG/5ML syrup Take 5 mLs by mouth 4 (four) times daily as needed for cough. 118 mL 0   rosuvastatin (CRESTOR) 10 MG tablet Take 1 tablet (10 mg total) by mouth daily. 90 tablet 3   Semaglutide, 1 MG/DOSE, 4 MG/3ML SOPN Inject 1 mg as directed once a week. 3 mL 5   sildenafil (VIAGRA) 100 MG tablet Take 0.5-1 tablets (50-100 mg total) by mouth daily as needed for erectile dysfunction. 4 tablet 14   valsartan-hydrochlorothiazide (DIOVAN-HCT) 320-25 MG tablet Take 1 tablet by mouth daily. 90 tablet 3  No current facility-administered medications on file prior to visit.   Allergies  Allergen Reactions   Lipitor [Atorvastatin] Other (See Comments)    Muscle cramps, cant walk, sluggish   Sulfa Antibiotics Itching and Rash   Social History   Socioeconomic History   Marital status: Married    Spouse name: Not on file   Number of children: Not on file   Years of education: Not on file   Highest education level: Not on file  Occupational History   Not on file  Tobacco Use   Smoking status: Never   Smokeless tobacco: Current    Types: Chew  Vaping Use   Vaping Use: Never used  Substance and Sexual Activity   Alcohol use: No   Drug use: No   Sexual activity: Not on file  Other Topics  Concern   Not on file  Social History Narrative   Not on file   Social Determinants of Health   Financial Resource Strain: Not on file  Food Insecurity: Not on file  Transportation Needs: Not on file  Physical Activity: Not on file  Stress: Not on file  Social Connections: Not on file  Intimate Partner Violence: Not on file     Review of Systems  All other systems reviewed and are negative.      Objective:   Physical Exam Vitals reviewed.  Constitutional:      General: He is not in acute distress.    Appearance: Normal appearance. He is normal weight. He is not ill-appearing.  HENT:     Right Ear: Tympanic membrane and ear canal normal.     Left Ear: Tympanic membrane and ear canal normal.     Nose:     Right Sinus: Frontal sinus tenderness present. No maxillary sinus tenderness.     Left Sinus: No maxillary sinus tenderness or frontal sinus tenderness.  Cardiovascular:     Rate and Rhythm: Regular rhythm. Tachycardia present.     Pulses: Normal pulses.     Heart sounds: Normal heart sounds. No murmur heard.    No friction rub. No gallop.  Pulmonary:     Effort: Pulmonary effort is normal. No respiratory distress.     Breath sounds: Normal breath sounds. No wheezing, rhonchi or rales.  Musculoskeletal:     Right lower leg: No edema.     Left lower leg: No edema.  Neurological:     Mental Status: He is alert.           Assessment & Plan:  Acute non-recurrent frontal sinusitis Given his orthostatic dizziness, I have recommended that he hold doxazosin and push fluids.  I also recommended Augmentin 875 mg twice daily for 10 days for sinusitis.  Resume doxazosin once he is feeling stronger and recovered

## 2022-09-07 ENCOUNTER — Encounter: Payer: Self-pay | Admitting: Family Medicine

## 2022-09-07 ENCOUNTER — Ambulatory Visit (INDEPENDENT_AMBULATORY_CARE_PROVIDER_SITE_OTHER): Payer: Medicare Other | Admitting: Family Medicine

## 2022-09-07 ENCOUNTER — Telehealth: Payer: Medicare Other | Admitting: Family Medicine

## 2022-09-07 ENCOUNTER — Other Ambulatory Visit: Payer: Self-pay | Admitting: Family Medicine

## 2022-09-07 VITALS — BP 120/70 | HR 97 | Temp 98.2°F | Ht 70.0 in | Wt 250.0 lb

## 2022-09-07 DIAGNOSIS — J0110 Acute frontal sinusitis, unspecified: Secondary | ICD-10-CM

## 2022-09-07 DIAGNOSIS — J329 Chronic sinusitis, unspecified: Secondary | ICD-10-CM

## 2022-09-07 MED ORDER — LEVOFLOXACIN 500 MG PO TABS: 500.0000 mg | ORAL_TABLET | Freq: Every day | ORAL | 0 refills | Status: AC

## 2022-09-07 MED ORDER — LEVOFLOXACIN 500 MG PO TABS: 500.0000 mg | ORAL_TABLET | Freq: Every day | ORAL | 0 refills | Status: DC

## 2022-09-07 NOTE — Progress Notes (Signed)
Acute Office Visit  Subjective:     Patient ID: James Ponce, male    DOB: 13-May-1956, 67 y.o.   MRN: [id number]  Chief Complaint  Patient presents with   Acute Visit    ongoing sinus issues; issues returned after he finished his antibiotic - J[facility]\\\    HPI Patient is in today for ongoing cough, right-sided headache, mucopurulent drainage when he blows his nose, postnasal drip. He got well with antibiotics that were started on 2/6 and he started feeling bad again Friday or Saturday, 4 days ago. Denies rhinorrhea, fever, shortness of breath, wheezing No known sick exposures. Has tried Mucinex.  Review of Systems  All other systems reviewed and are negative.       Objective:    BP 120/70   Pulse 97   Temp 98.2 F (36.8 C) (Oral)   Ht '5\' 10"'$  (1.778 m)   Wt 250 lb (113.4 kg)   SpO2 97%   BMI 35.87 kg/m    Physical Exam Vitals and nursing note reviewed.  Constitutional:      Appearance: Normal appearance. He is normal weight.  HENT:     Head: Normocephalic and atraumatic.     Nose: Congestion present.     Right Sinus: Frontal sinus tenderness present.     Mouth/Throat:     Mouth: Mucous membranes are moist.     Pharynx: Oropharynx is clear.  Eyes:     Conjunctiva/sclera: Conjunctivae normal.  Cardiovascular:     Rate and Rhythm: Normal rate and regular rhythm.     Pulses: Normal pulses.     Heart sounds: Normal heart sounds.  Pulmonary:     Effort: Pulmonary effort is normal.     Breath sounds: Normal breath sounds.  Skin:    General: Skin is warm and dry.     Capillary Refill: Capillary refill takes less than 2 seconds.  Neurological:     General: No focal deficit present.     Mental Status: He is alert and oriented to person, place, and time. Mental status is at baseline.  Psychiatric:        Mood and Affect: Mood normal.        Behavior: Behavior normal.        Thought Content: Thought content normal.        Judgment: Judgment normal.     No  results found for any visits on 09/07/22.      Assessment & Plan:   Problem List Items Addressed This Visit       Respiratory   Acute non-recurrent frontal sinusitis - Primary    Patients symptoms consistent with resickening sinusitis. Levaquin has been sent in by my partner earlier today. Will resend Levaquin '500mg'$  daily for 7 days to his preferred pharmacy. Continue supportive care. Increase fluid intake with water or electrolyte solution like pedialyte. Encouraged acetaminophen as needed for fever/pain. Encouraged salt water gargling, chloraseptic spray and throat lozenges. Encouraged OTC guaifenesin. Encouraged saline sinus flushes and/or neti with humidified air. Return to office if symptoms persist or worsen.       Relevant Medications   levofloxacin (LEVAQUIN) 500 MG tablet    Meds ordered this encounter  Medications   levofloxacin (LEVAQUIN) 500 MG tablet    Sig: Take 1 tablet (500 mg total) by mouth daily for 7 days.    Dispense:  7 tablet    Refill:  0    Order Specific Question:   Supervising Provider    Answer:  PICKARD, WARREN T [[id number]]    Return if symptoms worsen or fail to improve.  Rubie Maid, FNP

## 2022-09-07 NOTE — Progress Notes (Signed)
Virtual Visit Consent   [name], you are scheduled for a virtual visit with a [facility] provider today. Just as with appointments in the office, your consent must be obtained to participate. Your consent will be active for this visit and any virtual visit you may have with one of our providers in the next 365 days. If you have a MyChart account, a copy of this consent can be sent to you electronically.  As this is a virtual visit, video technology does not allow for your provider to perform a traditional examination. This may limit your provider's ability to fully assess your condition. If your provider identifies any concerns that need to be evaluated in person or the need to arrange testing (such as labs, EKG, etc.), we will make arrangements to do so. Although advances in technology are sophisticated, we cannot ensure that it will always work on either your end or our end. If the connection with a video visit is poor, the visit may have to be switched to a telephone visit. With either a video or telephone visit, we are not always able to ensure that we have a secure connection.  By engaging in this virtual visit, you consent to the provision of healthcare and authorize for your insurance to be billed (if applicable) for the services provided during this visit. Depending on your insurance coverage, you may receive a charge related to this service.  I need to obtain your verbal consent now. Are you willing to proceed with your visit today? James Ponce has provided verbal consent on 09/07/2022 for a virtual visit (video or telephone). [name], NP  Date: 09/07/2022 10:01 AM  Virtual Visit via Video Note   I, [name], connected with  James Ponce  ([id number], 12/12/1955) on 09/07/22 at 10:00 AM EST by a video-enabled telemedicine application and verified that I am speaking with the correct person using two identifiers.  Location: Patient: Virtual Visit Location Patient:  Home Provider: Virtual Visit Location Provider: Home Office   I discussed the limitations of evaluation and management by telemedicine and the availability of in person appointments. The patient expressed understanding and agreed to proceed.    History of Present Illness: James Ponce is a 66 y.o. who identifies as a male who was assigned male at birth, and is being seen today for on going symptoms.  Had on going cough and congestion, but has a persistent headache. Has had a zpack and Augmentin in last 30 days for treatment of both sinus and LRI infections.   Reports he is currently taking flonase with astelin, claritin - all without relief. Tried tylenol with min impact.   Problems:  Patient Active Problem List   Diagnosis Date Noted   Diabetes mellitus type II, uncontrolled    Hypertension    Hyperlipidemia    Arthritis    Prediabetes     Allergies:  Allergies  Allergen Reactions   Lipitor [Atorvastatin] Other (See Comments)    Muscle cramps, cant walk, sluggish   Sulfa Antibiotics Itching and Rash   Medications:  Current Outpatient Medications:    metFORMIN (GLUCOPHAGE-XR) 500 MG 24 hr tablet, Take 4 tablets (2,000 mg total) by mouth daily with breakfast., Disp: 120 tablet, Rfl: 3   albuterol (VENTOLIN HFA) 108 (90 Base) MCG/ACT inhaler, Inhale 1-2 puffs into the lungs every 6 (six) hours as needed for wheezing or shortness of breath., Disp: 8 g, Rfl: 0   amoxicillin-clavulanate (AUGMENTIN) 875-125 MG tablet, Take 1 tablet  by mouth 2 (two) times daily., Disp: 20 tablet, Rfl: 0   aspirin EC 81 MG tablet, Take 81 mg by mouth daily., Disp: , Rfl:    Azelastine HCl 137 MCG/SPRAY SOLN, USE IN EACH NOSTRIL AS DIRECTED, Disp: 30 mL, Rfl: 3   azithromycin (ZITHROMAX) 250 MG tablet, Take 1 tablet (250 mg total) by mouth daily. Take first 2 tablets together, then 1 every day until finished., Disp: 6 tablet, Rfl: 0   benzonatate (TESSALON) 100 MG capsule, Take 1 capsule (100 mg total)  by mouth every 8 (eight) hours., Disp: 21 capsule, Rfl: 0   brompheniramine-pseudoephedrine-DM 30-2-10 MG/5ML syrup, Take 5 mLs by mouth 4 (four) times daily as needed., Disp: 140 mL, Rfl: 0   celecoxib (CELEBREX) 100 MG capsule, Per pt's wife , pt has been on this meds for a long time., Disp: 90 capsule, Rfl: 3   dapagliflozin propanediol (FARXIGA) 10 MG TABS tablet, TAKE 1 TABLET BY MOUTH EVERY DAY BEFORE BREAKFAST, Disp: 90 tablet, Rfl: 3   doxazosin (CARDURA) 4 MG tablet, Take 1 tablet (4 mg total) by mouth daily., Disp: 90 tablet, Rfl: 3   fish oil-omega-3 fatty acids 1000 MG capsule, Take 1-2 capsules (1-2 g total) by mouth 2 (two) times daily. 2 capsules in morning, and 1 in evening, daily, Disp: 90 capsule, Rfl: 3   fluticasone (FLONASE) 50 MCG/ACT nasal spray, SPRAY 2 SPRAYS INTO EACH NOSTRIL EVERY DAY, Disp: 48 mL, Rfl: 2   Glucosamine-Chondroitin (GLUCOSAMINE CHONDR COMPLEX PO), Take 1 tablet by mouth 2 (two) times daily., Disp: , Rfl:    glucose blood test strip, Use to check blood sugar twice daily.*Dispense Contour next test strips*, Disp: 100 each, Rfl: 12   loratadine (CLARITIN) 10 MG tablet, Take 1 tablet (10 mg total) by mouth daily., Disp: 30 tablet, Rfl: 11   Multiple Vitamin (MULTIVITAMIN) tablet, Take 1 tablet by mouth daily., Disp: , Rfl:    promethazine-dextromethorphan (PROMETHAZINE-DM) 6.25-15 MG/5ML syrup, Take 5 mLs by mouth 4 (four) times daily as needed for cough., Disp: 118 mL, Rfl: 0   rosuvastatin (CRESTOR) 10 MG tablet, Take 1 tablet (10 mg total) by mouth daily., Disp: 90 tablet, Rfl: 3   Semaglutide, 1 MG/DOSE, 4 MG/3ML SOPN, Inject 1 mg as directed once a week., Disp: 3 mL, Rfl: 5   sildenafil (VIAGRA) 100 MG tablet, Take 0.5-1 tablets (50-100 mg total) by mouth daily as needed for erectile dysfunction., Disp: 4 tablet, Rfl: 14   valsartan-hydrochlorothiazide (DIOVAN-HCT) 320-25 MG tablet, Take 1 tablet by mouth daily., Disp: 90 tablet, Rfl:  3  Observations/Objective: Patient is well-developed, well-nourished in no acute distress.  Resting comfortably at home.  Head is normocephalic, atraumatic.  No labored breathing.  Speech is clear and coherent with logical content.  Patient is alert and oriented at baseline.    Assessment and Plan:  1. Recurrent sinusitis  Advised in person-   Unsure if rebound vs on going sinus condition Needs in person- given use of two ABX and OTC without relief.  Patient acknowledged agreement and understanding of the plan.   Past Medical, Surgical, Social History, Allergies, and Medications have been Reviewed.    Follow Up Instructions: I discussed the assessment and treatment plan with the patient. The patient was provided an opportunity to ask questions and all were answered. The patient agreed with the plan and demonstrated an understanding of the instructions.  A copy of instructions were sent to the patient via MyChart unless otherwise noted below.  The patient was advised to call back or seek an in-person evaluation if the symptoms worsen or if the condition fails to improve as anticipated.  Time:  I spent 10 minutes with the patient via telehealth technology discussing the above problems/concerns.    [name], NP

## 2022-09-07 NOTE — Patient Instructions (Signed)
Ferdinand Cava, thank you for joining [name], NP for today's virtual visit.  While this provider is not your primary care provider (PCP), if your PCP is located in our provider database this encounter information will be shared with them immediately following your visit.   [facility] account gives you access to today's visit and all your visits, tests, and labs performed at [facility] " click here if you don't have a [facility] account or go to [email or web address]  Consent: (Patient) James Ponce provided verbal consent for this virtual visit at the beginning of the encounter.  Current Medications:  Current Outpatient Medications:    metFORMIN (GLUCOPHAGE-XR) 500 MG 24 hr tablet, Take 4 tablets (2,000 mg total) by mouth daily with breakfast., Disp: 120 tablet, Rfl: 3   albuterol (VENTOLIN HFA) 108 (90 Base) MCG/ACT inhaler, Inhale 1-2 puffs into the lungs every 6 (six) hours as needed for wheezing or shortness of breath., Disp: 8 g, Rfl: 0   amoxicillin-clavulanate (AUGMENTIN) 875-125 MG tablet, Take 1 tablet by mouth 2 (two) times daily., Disp: 20 tablet, Rfl: 0   aspirin EC 81 MG tablet, Take 81 mg by mouth daily., Disp: , Rfl:    Azelastine HCl 137 MCG/SPRAY SOLN, USE IN EACH NOSTRIL AS DIRECTED, Disp: 30 mL, Rfl: 3   azithromycin (ZITHROMAX) 250 MG tablet, Take 1 tablet (250 mg total) by mouth daily. Take first 2 tablets together, then 1 every day until finished., Disp: 6 tablet, Rfl: 0   benzonatate (TESSALON) 100 MG capsule, Take 1 capsule (100 mg total) by mouth every 8 (eight) hours., Disp: 21 capsule, Rfl: 0   brompheniramine-pseudoephedrine-DM 30-2-10 MG/5ML syrup, Take 5 mLs by mouth 4 (four) times daily as needed., Disp: 140 mL, Rfl: 0   celecoxib (CELEBREX) 100 MG capsule, Per pt's wife , pt has been on this meds for a long time., Disp: 90 capsule, Rfl: 3   dapagliflozin propanediol (FARXIGA) 10 MG TABS tablet, TAKE 1 TABLET BY MOUTH  EVERY DAY BEFORE BREAKFAST, Disp: 90 tablet, Rfl: 3   doxazosin (CARDURA) 4 MG tablet, Take 1 tablet (4 mg total) by mouth daily., Disp: 90 tablet, Rfl: 3   fish oil-omega-3 fatty acids 1000 MG capsule, Take 1-2 capsules (1-2 g total) by mouth 2 (two) times daily. 2 capsules in morning, and 1 in evening, daily, Disp: 90 capsule, Rfl: 3   fluticasone (FLONASE) 50 MCG/ACT nasal spray, SPRAY 2 SPRAYS INTO EACH NOSTRIL EVERY DAY, Disp: 48 mL, Rfl: 2   Glucosamine-Chondroitin (GLUCOSAMINE CHONDR COMPLEX PO), Take 1 tablet by mouth 2 (two) times daily., Disp: , Rfl:    glucose blood test strip, Use to check blood sugar twice daily.*Dispense Contour next test strips*, Disp: 100 each, Rfl: 12   loratadine (CLARITIN) 10 MG tablet, Take 1 tablet (10 mg total) by mouth daily., Disp: 30 tablet, Rfl: 11   Multiple Vitamin (MULTIVITAMIN) tablet, Take 1 tablet by mouth daily., Disp: , Rfl:    promethazine-dextromethorphan (PROMETHAZINE-DM) 6.25-15 MG/5ML syrup, Take 5 mLs by mouth 4 (four) times daily as needed for cough., Disp: 118 mL, Rfl: 0   rosuvastatin (CRESTOR) 10 MG tablet, Take 1 tablet (10 mg total) by mouth daily., Disp: 90 tablet, Rfl: 3   Semaglutide, 1 MG/DOSE, 4 MG/3ML SOPN, Inject 1 mg as directed once a week., Disp: 3 mL, Rfl: 5   sildenafil (VIAGRA) 100 MG tablet, Take 0.5-1 tablets (50-100 mg total) by mouth daily as needed for erectile dysfunction., Disp: 4  tablet, Rfl: 14   valsartan-hydrochlorothiazide (DIOVAN-HCT) 320-25 MG tablet, Take 1 tablet by mouth daily., Disp: 90 tablet, Rfl: 3   Medications ordered in this encounter:  No orders of the defined types were placed in this encounter.    *If you need refills on other medications prior to your next appointment, please contact your pharmacy*  Follow-Up: Call back or seek an in-person evaluation if the symptoms worsen or if the condition fails to improve as anticipated.  [facility] [phone]  Other  Instructions    If you have been instructed to have an in-person evaluation today at a local Urgent Care facility, please use the link below. It will take you to a list of all of our available [facility], including address, phone number and hours of operation. Please do not delay care.  [facility]  If you or a family member do not have a primary care provider, use the link below to schedule a visit and establish care. When you choose a [facility] primary care physician or advanced practice provider, you gain a long-term partner in health. Find a Primary Care Provider  Learn more about [facility]'s in-office and virtual care options: [facility]

## 2022-09-07 NOTE — Assessment & Plan Note (Signed)
Patients symptoms consistent with resickening sinusitis. Levaquin has been sent in by my partner earlier today. Will resend Levaquin '500mg'$  daily for 7 days to his preferred pharmacy. Continue supportive care. Increase fluid intake with water or electrolyte solution like pedialyte. Encouraged acetaminophen as needed for fever/pain. Encouraged salt water gargling, chloraseptic spray and throat lozenges. Encouraged OTC guaifenesin. Encouraged saline sinus flushes and/or neti with humidified air. Return to office if symptoms persist or worsen.

## 2022-09-20 ENCOUNTER — Other Ambulatory Visit: Payer: Self-pay | Admitting: Family Medicine

## 2022-09-20 DIAGNOSIS — J302 Other seasonal allergic rhinitis: Secondary | ICD-10-CM

## 2022-09-28 ENCOUNTER — Other Ambulatory Visit: Payer: Self-pay

## 2022-09-28 DIAGNOSIS — J302 Other seasonal allergic rhinitis: Secondary | ICD-10-CM

## 2022-09-28 MED ORDER — AZELASTINE HCL 0.1 % NA SOLN: NASAL | 3 refills | Status: DC

## 2022-10-11 ENCOUNTER — Other Ambulatory Visit: Payer: Self-pay | Admitting: Family Medicine

## 2022-10-11 DIAGNOSIS — E118 Type 2 diabetes mellitus with unspecified complications: Secondary | ICD-10-CM

## 2022-10-13 NOTE — Telephone Encounter (Signed)
Requested Prescriptions  Pending Prescriptions Disp Refills   metFORMIN (GLUCOPHAGE-XR) 500 MG 24 hr tablet [Pharmacy Med Name: METFORMIN ER 500MG  24HR TABS] 360 tablet 0    Sig: TAKE 4 TABLETS(2000 MG) BY MOUTH DAILY WITH BREAKFAST     Endocrinology:  Diabetes - Biguanides Failed - 10/11/2022  2:41 PM      Failed - B12 Level in normal range and within 720 days    No results found for: "VITAMINB12"       Failed - Valid encounter within last 6 months    Recent Outpatient Visits           1 year ago Controlled type 2 diabetes mellitus with complication, without long-term current use of insulin (HCC)   [facility] Pickard, Priscille Heidelberg, MD   2 years ago Allergic cough   [facility] Tanya Nones, Priscille Heidelberg, MD   2 years ago Diabetes mellitus without complication (HCC)   [facility] Donita Brooks, MD   2 years ago Inflamed sebaceous cyst   [facility] Donita Brooks, MD   3 years ago Diabetes mellitus without complication (HCC)   [facility] Pickard, Priscille Heidelberg, MD              Passed - Cr in normal range and within 360 days    Creat  Date Value Ref Range Status  06/03/2022 1.05 0.70 - 1.35 mg/dL Final   Creatinine, Urine  Date Value Ref Range Status  06/03/2022 84 20 - 320 mg/dL Final         Passed - HBA1C is between 0 and 7.9 and within 180 days    Hgb A1C (fingerstick)  Date Value Ref Range Status  12/28/2014 6.7 (H) <5.7 % Final    Comment:                                                                           According to the ADA Clinical Practice Recommendations for 2011, when HbA1c is used as a screening test:     >=6.5%   Diagnostic of Diabetes Mellitus            (if abnormal result is confirmed)   5.7-6.4%   Increased risk of developing Diabetes Mellitus   References:Diagnosis and Classification of Diabetes Mellitus,Diabetes Care,2011,34(Suppl 1):S62-S69 and Standards of  Medical Care in         Diabetes - 2011,Diabetes Care,2011,34 (Suppl 1):S11-S61.      Hgb A1c MFr Bld  Date Value Ref Range Status  06/03/2022 7.0 (H) <5.7 % of total Hgb Final    Comment:    For someone without known diabetes, a hemoglobin A1c value of 6.5% or greater indicates that they may have  diabetes and this should be confirmed with a follow-up  test. . For someone with known diabetes, a value <7% indicates  that their diabetes is well controlled and a value  greater than or equal to 7% indicates suboptimal  control. A1c targets should be individualized based on  duration of diabetes, age, comorbid conditions, and  other considerations. . Currently, no consensus exists regarding use of hemoglobin A1c for diagnosis of diabetes for children. Marland Kitchen  Passed - eGFR in normal range and within 360 days    GFR, Est African American  Date Value Ref Range Status  03/25/2020 110 > OR = 60 mL/min/1.78m2 Final   GFR, Est Non African American  Date Value Ref Range Status  03/25/2020 95 > OR = 60 mL/min/1.27m2 Final   eGFR  Date Value Ref Range Status  06/03/2022 79 > OR = 60 mL/min/1.39m2 Final         Passed - CBC within normal limits and completed in the last 12 months    WBC  Date Value Ref Range Status  06/03/2022 6.2 3.8 - 10.8 Thousand/uL Final   RBC  Date Value Ref Range Status  06/03/2022 5.60 4.20 - 5.80 Million/uL Final   Hemoglobin  Date Value Ref Range Status  06/03/2022 17.5 (H) 13.2 - 17.1 g/dL Final   HCT  Date Value Ref Range Status  06/03/2022 49.8 38.5 - 50.0 % Final   MCHC  Date Value Ref Range Status  06/03/2022 35.1 32.0 - 36.0 g/dL Final   [facility]  Date Value Ref Range Status  06/03/2022 31.3 27.0 - 33.0 pg Final   MCV  Date Value Ref Range Status  06/03/2022 88.9 80.0 - 100.0 fL Final   No results found for: "PLTCOUNTKUC", "LABPLAT", "POCPLA" RDW  Date Value Ref Range Status  06/03/2022 12.9 11.0 - 15.0 % Final

## 2022-10-30 DIAGNOSIS — H2513 Age-related nuclear cataract, bilateral: Secondary | ICD-10-CM | POA: Diagnosis not present

## 2022-10-30 DIAGNOSIS — E119 Type 2 diabetes mellitus without complications: Secondary | ICD-10-CM | POA: Diagnosis not present

## 2022-10-30 DIAGNOSIS — H35361 Drusen (degenerative) of macula, right eye: Secondary | ICD-10-CM | POA: Diagnosis not present

## 2022-10-30 DIAGNOSIS — H524 Presbyopia: Secondary | ICD-10-CM | POA: Diagnosis not present

## 2022-10-30 DIAGNOSIS — H43812 Vitreous degeneration, left eye: Secondary | ICD-10-CM | POA: Diagnosis not present

## 2022-10-30 LAB — HM DIABETES EYE EXAM

## 2022-11-02 ENCOUNTER — Encounter: Payer: Self-pay | Admitting: Family Medicine

## 2022-11-26 DIAGNOSIS — L400 Psoriasis vulgaris: Secondary | ICD-10-CM | POA: Diagnosis not present

## 2022-11-26 DIAGNOSIS — L570 Actinic keratosis: Secondary | ICD-10-CM | POA: Diagnosis not present

## 2022-11-26 DIAGNOSIS — L814 Other melanin hyperpigmentation: Secondary | ICD-10-CM | POA: Diagnosis not present

## 2022-11-26 DIAGNOSIS — D225 Melanocytic nevi of trunk: Secondary | ICD-10-CM | POA: Diagnosis not present

## 2022-11-26 DIAGNOSIS — L821 Other seborrheic keratosis: Secondary | ICD-10-CM | POA: Diagnosis not present

## 2022-12-28 ENCOUNTER — Other Ambulatory Visit: Payer: Self-pay | Admitting: Family Medicine

## 2023-01-03 ENCOUNTER — Other Ambulatory Visit: Payer: Self-pay | Admitting: Family Medicine

## 2023-01-03 DIAGNOSIS — M1990 Unspecified osteoarthritis, unspecified site: Secondary | ICD-10-CM

## 2023-01-04 DIAGNOSIS — K080 Exfoliation of teeth due to systemic causes: Secondary | ICD-10-CM | POA: Diagnosis not present

## 2023-01-10 ENCOUNTER — Other Ambulatory Visit: Payer: Self-pay | Admitting: Family Medicine

## 2023-01-10 DIAGNOSIS — E118 Type 2 diabetes mellitus with unspecified complications: Secondary | ICD-10-CM

## 2023-01-11 NOTE — Telephone Encounter (Signed)
Requested Prescriptions  Pending Prescriptions Disp Refills   metFORMIN (GLUCOPHAGE-XR) 500 MG 24 hr tablet [Pharmacy Med Name: METFORMIN ER 500MG  24HR TABS] 360 tablet 0    Sig: TAKE 4 TABLETS(2000 MG) BY MOUTH DAILY WITH BREAKFAST     Endocrinology:  Diabetes - Biguanides Failed - 01/10/2023  2:23 PM      Failed - HBA1C is between 0 and 7.9 and within 180 days    Hgb A1C (fingerstick)  Date Value Ref Range Status  12/28/2014 6.7 (H) <5.7 % Final    Comment:                                                                           According to the ADA Clinical Practice Recommendations for 2011, when HbA1c is used as a screening test:     >=6.5%   Diagnostic of Diabetes Mellitus            (if abnormal result is confirmed)   5.7-6.4%   Increased risk of developing Diabetes Mellitus   References:Diagnosis and Classification of Diabetes Mellitus,Diabetes Care,2011,34(Suppl 1):S62-S69 and Standards of Medical Care in         Diabetes - 2011,Diabetes Care,2011,34 (Suppl 1):S11-S61.      Hgb A1c MFr Bld  Date Value Ref Range Status  06/03/2022 7.0 (H) <5.7 % of total Hgb Final    Comment:    For someone without known diabetes, a hemoglobin A1c value of 6.5% or greater indicates that they may have  diabetes and this should be confirmed with a follow-up  test. . For someone with known diabetes, a value <7% indicates  that their diabetes is well controlled and a value  greater than or equal to 7% indicates suboptimal  control. A1c targets should be individualized based on  duration of diabetes, age, comorbid conditions, and  other considerations. . Currently, no consensus exists regarding use of hemoglobin A1c for diagnosis of diabetes for children. .          Failed - B12 Level in normal range and within 720 days    No results found for: "VITAMINB12"       Failed - Valid encounter within last 6 months    Recent Outpatient Visits           1 year ago Controlled type 2  diabetes mellitus with complication, without long-term current use of insulin (HCC)   [facility] Pickard, Priscille Heidelberg, MD   2 years ago Allergic cough   [facility] Donita Brooks, MD   2 years ago Diabetes mellitus without complication [facility])   [facility] Tanya Nones, Priscille Heidelberg, MD   3 years ago Inflamed sebaceous cyst   [facility] Donita Brooks, MD   3 years ago Diabetes mellitus without complication [facility])   [facility] Pickard, Priscille Heidelberg, MD              Passed - Cr in normal range and within 360 days    Creat  Date Value Ref Range Status  06/03/2022 1.05 0.70 - 1.35 mg/dL Final   Creatinine, Urine  Date Value Ref Range Status  06/03/2022 84 20 - 320  mg/dL Final         Passed - eGFR in normal range and within 360 days    GFR, Est African American  Date Value Ref Range Status  03/25/2020 110 > OR = 60 mL/min/1.32m2 Final   GFR, Est Non African American  Date Value Ref Range Status  03/25/2020 95 > OR = 60 mL/min/1.58m2 Final   eGFR  Date Value Ref Range Status  06/03/2022 79 > OR = 60 mL/min/1.37m2 Final         Passed - CBC within normal limits and completed in the last 12 months    WBC  Date Value Ref Range Status  06/03/2022 6.2 3.8 - 10.8 Thousand/uL Final   RBC  Date Value Ref Range Status  06/03/2022 5.60 4.20 - 5.80 Million/uL Final   Hemoglobin  Date Value Ref Range Status  06/03/2022 17.5 (H) 13.2 - 17.1 g/dL Final   HCT  Date Value Ref Range Status  06/03/2022 49.8 38.5 - 50.0 % Final   MCHC  Date Value Ref Range Status  06/03/2022 35.1 32.0 - 36.0 g/dL Final   [facility]  Date Value Ref Range Status  06/03/2022 31.3 27.0 - 33.0 pg Final   MCV  Date Value Ref Range Status  06/03/2022 88.9 80.0 - 100.0 fL Final   No results found for: "PLTCOUNTKUC", "LABPLAT", "POCPLA" RDW  Date Value Ref Range Status  06/03/2022 12.9 11.0 - 15.0 % Final

## 2023-02-13 ENCOUNTER — Other Ambulatory Visit: Payer: Self-pay | Admitting: Family Medicine

## 2023-02-13 DIAGNOSIS — I10 Essential (primary) hypertension: Secondary | ICD-10-CM

## 2023-02-15 NOTE — Telephone Encounter (Signed)
Unable to refill per protocol, Rx request is too soon. Last refill 06/03/22 for 90 and 3 refills.  Requested Prescriptions  Pending Prescriptions Disp Refills   doxazosin (CARDURA) 4 MG tablet [Pharmacy Med Name: DOXAZOSIN 4MG  TABLETS] 90 tablet 3    Sig: TAKE 1 TABLET(4 MG) BY MOUTH DAILY     Cardiovascular:  Alpha Blockers Failed - 02/15/2023 11:52 AM      Failed - Valid encounter within last 6 months    Recent Outpatient Visits           1 year ago Controlled type 2 diabetes mellitus with complication, without long-term current use of insulin (HCC)   [facility] Pickard, Priscille Heidelberg, MD   2 years ago Allergic cough   [facility] Donita Brooks, MD   2 years ago Diabetes mellitus without complication (HCC)   [facility] Donita Brooks, MD   3 years ago Inflamed sebaceous cyst   [facility] Donita Brooks, MD   3 years ago Diabetes mellitus without complication (HCC)   [facility] Pickard, Priscille Heidelberg, MD              Passed - Last BP in normal range    BP Readings from Last 1 Encounters:  09/07/22 120/70          valsartan-hydrochlorothiazide (DIOVAN-HCT) 320-25 MG tablet [Pharmacy Med Name: VALSARTAN/HCTZ 320MG /25MG  TABLETS] 90 tablet 3    Sig: TAKE 1 TABLET BY MOUTH DAILY     Cardiovascular: ARB + Diuretic Combos Failed - 02/15/2023 11:52 AM      Failed - K in normal range and within 180 days    Potassium  Date Value Ref Range Status  06/03/2022 4.1 3.5 - 5.3 mmol/L Final         Failed - Na in normal range and within 180 days    Sodium  Date Value Ref Range Status  06/03/2022 138 135 - 146 mmol/L Final         Failed - Cr in normal range and within 180 days    Creat  Date Value Ref Range Status  06/03/2022 1.05 0.70 - 1.35 mg/dL Final   Creatinine, Urine  Date Value Ref Range Status  06/03/2022 84 20 - 320 mg/dL Final         Failed - eGFR is 10 or above and within  180 days    GFR, Est African American  Date Value Ref Range Status  03/25/2020 110 > OR = 60 mL/min/1.35m2 Final   GFR, Est Non African American  Date Value Ref Range Status  03/25/2020 95 > OR = 60 mL/min/1.44m2 Final   eGFR  Date Value Ref Range Status  06/03/2022 79 > OR = 60 mL/min/1.61m2 Final         Failed - Valid encounter within last 6 months    Recent Outpatient Visits           1 year ago Controlled type 2 diabetes mellitus with complication, without long-term current use of insulin (HCC)   [facility] Pickard, Priscille Heidelberg, MD   2 years ago Allergic cough   [facility] Donita Brooks, MD   2 years ago Diabetes mellitus without complication [facility])   [facility] Donita Brooks, MD   3 years ago Inflamed sebaceous cyst   [facility] Donita Brooks, MD   3 years ago  Diabetes mellitus without complication [facility]   [facility] Pickard, Priscille Heidelberg, MD              Passed - Patient is not pregnant      Passed - Last BP in normal range    BP Readings from Last 1 Encounters:  09/07/22 120/70

## 2023-02-15 NOTE — Telephone Encounter (Signed)
Prescription Request  02/15/2023  LOV: 08/11/2022  What is the name of the medication or equipment? Semaglutide, 1 MG/DOSE, (OZEMPIC, 1 MG/DOSE,) 4 MG/3ML SOPN [[id number]]  Have you contacted your pharmacy to request a refill? No   Which pharmacy would you like this sent to?  [id number] - [place], [place] - [address], [place]   Patient notified that their request is being sent to the clinical staff for review and that they should receive a response within 2 business days.   Please advise at [facility] [phone]

## 2023-02-24 NOTE — Patient Instructions (Incomplete)
James Ponce , Thank you for taking time to come for your Medicare Wellness Visit. I appreciate your ongoing commitment to your health goals. Please review the following plan we discussed and let me know if I can assist you in the future.   Referrals/Orders/Follow-Ups/Clinician Recommendations: Aim for 30 minutes of exercise or brisk walking, 6-8 glasses of water, and 5 servings of fruits and vegetables each day.  Please schedule a follow up visit with Dr. Tanya Nones for early December.   This is a list of the screening recommended for you and due dates:  Health Maintenance  Topic Date Due   Medicare Annual Wellness Visit  Never done   COVID-19 Vaccine (1 - 2023-24 season) Never done   Hemoglobin A1C  12/02/2022   Complete foot exam   12/17/2022   Flu Shot  02/04/2023   Hepatitis C Screening  06/04/2023*   Yearly kidney function blood test for diabetes  06/04/2023   Yearly kidney health urinalysis for diabetes  06/04/2023   Eye exam for diabetics  10/30/2023   Colon Cancer Screening  12/22/2027   DTaP/Tdap/Td vaccine (5 - Td or Tdap) 04/02/2031   Pneumonia Vaccine  Completed   Zoster (Shingles) Vaccine  Completed   HPV Vaccine  Aged Out  *Topic was postponed. The date shown is not the original due date.    Advanced directives: (ACP Link)Information on Advanced Care Planning can be found at [facility] Advance Health Care Directives Advance Health Care Directives ([email or web address]   Next Medicare Annual Wellness Visit scheduled for next year: Yes

## 2023-02-25 ENCOUNTER — Ambulatory Visit (INDEPENDENT_AMBULATORY_CARE_PROVIDER_SITE_OTHER): Payer: Medicare Other

## 2023-02-25 VITALS — BP 126/68 | Ht 70.0 in | Wt 247.0 lb

## 2023-02-25 DIAGNOSIS — Z0000 Encounter for general adult medical examination without abnormal findings: Secondary | ICD-10-CM

## 2023-02-25 NOTE — Progress Notes (Addendum)
Subjective:   James Ponce is a 67 y.o. male who presents for an Initial Medicare Annual Wellness Visit.  Visit Complete: In Person   Review of Systems     Cardiac Risk Factors include: advanced age (>57men, >29 women);diabetes mellitus;dyslipidemia;hypertension;male gender     Objective:    Today's Vitals   02/25/23 0920  BP: 126/68  Weight: 247 lb (112 kg)  Height: 5\' 10"  (1.778 m)   Body mass index is 35.44 kg/m.     02/25/2023    9:37 AM 02/25/2023    9:30 AM  Advanced Directives  Does Patient Have a Medical Advance Directive? No No  Would patient like information on creating a medical advance directive? Yes (MAU/Ambulatory/Procedural Areas - Information given) Yes (MAU/Ambulatory/Procedural Areas - Information given)    Current Medications (verified) Outpatient Encounter Medications as of 02/25/2023  Medication Sig   albuterol (VENTOLIN HFA) 108 (90 Base) MCG/ACT inhaler Inhale 1-2 puffs into the lungs every 6 (six) hours as needed for wheezing or shortness of breath.   aspirin EC 81 MG tablet Take 81 mg by mouth daily.   azelastine (ASTELIN) 0.1 % nasal spray Use 2 (two) sprays in each nostril once a day.   benzonatate (TESSALON) 100 MG capsule Take 1 capsule (100 mg total) by mouth every 8 (eight) hours.   brompheniramine-pseudoephedrine-DM 30-2-10 MG/5ML syrup Take 5 mLs by mouth 4 (four) times daily as needed.   celecoxib (CELEBREX) 100 MG capsule TAKE 1 CAPSULE BY MOUTH DAILY   dapagliflozin propanediol (FARXIGA) 10 MG TABS tablet TAKE 1 TABLET BY MOUTH EVERY DAY BEFORE BREAKFAST   doxazosin (CARDURA) 4 MG tablet Take 1 tablet (4 mg total) by mouth daily.   fish oil-omega-3 fatty acids 1000 MG capsule Take 1-2 capsules (1-2 g total) by mouth 2 (two) times daily. 2 capsules in morning, and 1 in evening, daily   fluticasone (FLONASE) 50 MCG/ACT nasal spray SPRAY 2 SPRAYS INTO EACH NOSTRIL EVERY DAY   Glucosamine-Chondroitin (GLUCOSAMINE CHONDR COMPLEX PO) Take 1  tablet by mouth 2 (two) times daily.   glucose blood test strip Use to check blood sugar twice daily.*Dispense Contour next test strips*   loratadine (CLARITIN) 10 MG tablet Take 1 tablet (10 mg total) by mouth daily.   metFORMIN (GLUCOPHAGE-XR) 500 MG 24 hr tablet TAKE 4 TABLETS(2000 MG) BY MOUTH DAILY WITH BREAKFAST   Multiple Vitamin (MULTIVITAMIN) tablet Take 1 tablet by mouth daily.   promethazine-dextromethorphan (PROMETHAZINE-DM) 6.25-15 MG/5ML syrup Take 5 mLs by mouth 4 (four) times daily as needed for cough.   rosuvastatin (CRESTOR) 10 MG tablet Take 1 tablet (10 mg total) by mouth daily.   Semaglutide, 1 MG/DOSE, (OZEMPIC, 1 MG/DOSE,) 4 MG/3ML SOPN INJECT 1 MG UNDER THE SKIN ONE DAY A WEEK   sildenafil (VIAGRA) 100 MG tablet Take 0.5-1 tablets (50-100 mg total) by mouth daily as needed for erectile dysfunction.   valsartan-hydrochlorothiazide (DIOVAN-HCT) 320-25 MG tablet Take 1 tablet by mouth daily.   No facility-administered encounter medications on file as of 02/25/2023.    Allergies (verified) Lipitor [atorvastatin] and Sulfa antibiotics   History: Past Medical History:  Diagnosis Date   Arthritis    Diabetes mellitus type II, uncontrolled    Diverticulosis    Hyperlipidemia    Hypertension    History reviewed. No pertinent surgical history. History reviewed. No pertinent family history. Social History   Socioeconomic History   Marital status: Married    Spouse name: Not on file   Number of children:  Not on file   Years of education: Not on file   Highest education level: Not on file  Occupational History   Not on file  Tobacco Use   Smoking status: Never   Smokeless tobacco: Current    Types: Chew  Vaping Use   Vaping status: Never Used  Substance and Sexual Activity   Alcohol use: No   Drug use: No   Sexual activity: Not on file  Other Topics Concern   Not on file  Social History Narrative   Not on file   Social Determinants of Health    Financial Resource Strain: Low Risk  (02/25/2023)   Overall Financial Resource Strain (CARDIA)    Difficulty of Paying Living Expenses: Not hard at all  Food Insecurity: No Food Insecurity (02/25/2023)   Hunger Vital Sign    Worried About Running Out of Food in the Last Year: Never true    Ran Out of Food in the Last Year: Never true  Transportation Needs: No Transportation Needs (02/25/2023)   PRAPARE - Administrator, Civil Service (Medical): No    Lack of Transportation (Non-Medical): No  Physical Activity: Sufficiently Active (02/25/2023)   Exercise Vital Sign    Days of Exercise per Week: 5 days    Minutes of Exercise per Session: 60 min  Stress: No Stress Concern Present (02/25/2023)   [employer] of Occupational Health - Occupational Stress Questionnaire    Feeling of Stress : Not at all  Social Connections: Moderately Integrated (02/25/2023)   Social Connection and Isolation Panel [NHANES]    Frequency of Communication with Friends and Family: More than three times a week    Frequency of Social Gatherings with Friends and Family: Three times a week    Attends Religious Services: More than 4 times per year    Active Member of Clubs or Organizations: No    Attends Banker Meetings: Never    Marital Status: Married    Tobacco Counseling Ready to quit: Not Answered Counseling given: Not Answered   Clinical Intake:  Pre-visit preparation completed: Yes  Pain : No/denies pain     Diabetes: Yes CBG done?: No Did pt. bring in CBG monitor from home?: No  How often do you need to have someone help you when you read instructions, pamphlets, or other written materials from your doctor or pharmacy?: 1 - Never  Interpreter Needed?: No  Information entered by :: Kandis Fantasia LPN   Activities of Daily Living    02/25/2023    9:35 AM  In your present state of health, do you have any difficulty performing the following activities:  Hearing?  0  Vision? 0  Difficulty concentrating or making decisions? 0  Walking or climbing stairs? 0  Dressing or bathing? 0  Doing errands, shopping? 0  Preparing Food and eating ? N  Using the Toilet? N  In the past six months, have you accidently leaked urine? N  Do you have problems with loss of bowel control? N  Managing your Medications? N  Managing your Finances? N  Housekeeping or managing your Housekeeping? N    Patient Care Team: Donita Brooks, MD as PCP - General (Family Medicine) [facility], [facility] (Optometry)  Indicate any recent Medical Services you may have received from other than [facility] providers in the past year (date may be approximate).     Assessment:   This is a routine wellness examination for Daneil.  Hearing/Vision screen Hearing  Screening - Comments:: Denies hearing difficulties   Vision Screening - Comments:: Wears rx glasses - up to date with routine eye exams with [facility]    Dietary issues and exercise activities discussed:     Goals Addressed             This Visit's Progress    Remain active and independent        Depression Screen    02/25/2023    9:36 AM 06/03/2022    7:53 AM 12/16/2021    7:55 AM 08/11/2019    8:08 AM 07/26/2018    8:08 AM 03/19/2017    8:06 AM 12/07/2013    8:40 AM  PHQ 2/9 Scores  PHQ - 2 Score 0 0 0 0 0 0 0    Fall Risk    02/25/2023    9:30 AM 06/03/2022    7:53 AM 12/16/2021    7:55 AM 07/26/2018    8:08 AM 12/07/2013    8:40 AM  Fall Risk   Falls in the past year? 0 0 0 0 No  Number falls in past yr: 0 0     Injury with Fall? 0 0     Risk for fall due to : No Fall Risks No Fall Risks     Follow up Falls prevention discussed;Education provided;Falls evaluation completed Falls prevention discussed Falls evaluation completed Falls evaluation completed     MEDICARE RISK AT HOME: Medicare Risk at Home Any stairs in or around the home?: No If so, are there any without handrails?: No Home free  of loose throw rugs in walkways, pet beds, electrical cords, etc?: Yes Adequate lighting in your home to reduce risk of falls?: Yes Life alert?: No Use of a cane, walker or w/c?: No Grab bars in the bathroom?: Yes Shower chair or bench in shower?: No Elevated toilet seat or a handicapped toilet?: No  TIMED UP AND GO:  Was the test performed? Yes  Length of time to ambulate 10 feet: 8 sec Gait steady and fast without use of assistive device    Cognitive Function:        02/25/2023    9:38 AM  6CIT Screen  What Year? 0 points  What month? 0 points  What time? 0 points  Count back from 20 0 points  Months in reverse 0 points  Repeat phrase 0 points  Total Score 0 points    Immunizations Immunization History  Administered Date(s) Administered   Fluad Quad(high Dose 65+) 08/11/2019   Influenza, High Dose Seasonal PF 06/03/2022   Influenza, Seasonal, Injecte, Preservative Fre 04/23/2013   Influenza,inj,Quad PF,6+ Mos 05/17/2014, 06/17/2016, 06/25/2017, 06/21/2018, 06/26/2020, 06/13/2021   Influenza-Unspecified 07/02/1999, 05/25/2002, 05/01/2004, 06/05/2005, 06/23/2007, 06/09/2011, 06/10/2012   PNEUMOCOCCAL CONJUGATE-20 12/16/2021   Pneumococcal Polysaccharide-23 07/26/2018   Td 08/04/1999, 07/15/2010   Tdap 07/15/2010, 04/01/2021   Zoster Recombinant(Shingrix) 07/22/2017, 10/20/2017    TDAP status: Due, Education has been provided regarding the importance of this vaccine. Advised may receive this vaccine at local pharmacy or Health Dept. Aware to provide a copy of the vaccination record if obtained from local pharmacy or Health Dept. Verbalized acceptance and understanding.  Flu Vaccine status: Due, Education has been provided regarding the importance of this vaccine. Advised may receive this vaccine at local pharmacy or Health Dept. Aware to provide a copy of the vaccination record if obtained from local pharmacy or Health Dept. Verbalized acceptance and  understanding.  Pneumococcal vaccine status: Up to date  Covid-19 vaccine status: Information provided on how to obtain vaccines.   Qualifies for Shingles Vaccine? Yes   Zostavax completed No   Shingrix Completed?: Yes  Screening Tests Health Maintenance  Topic Date Due   COVID-19 Vaccine (1 - 2023-24 season) Never done   HEMOGLOBIN A1C  12/02/2022   FOOT EXAM  12/17/2022   INFLUENZA VACCINE  02/04/2023   Hepatitis C Screening  06/04/2023 (Originally 02/07/1974)   Diabetic kidney evaluation - eGFR measurement  06/04/2023   Diabetic kidney evaluation - Urine ACR  06/04/2023   OPHTHALMOLOGY EXAM  10/30/2023   Medicare Annual Wellness (AWV)  02/25/2024   Colonoscopy  12/22/2027   DTaP/Tdap/Td (5 - Td or Tdap) 04/02/2031   Pneumonia Vaccine 43+ Years old  Completed   Zoster Vaccines- Shingrix  Completed   HPV VACCINES  Aged Out    Health Maintenance  Health Maintenance Due  Topic Date Due   COVID-19 Vaccine (1 - 2023-24 season) Never done   HEMOGLOBIN A1C  12/02/2022   FOOT EXAM  12/17/2022   INFLUENZA VACCINE  02/04/2023    Colorectal cancer screening: Type of screening: Colonoscopy. Completed 12/21/17. Repeat every 10 years  Lung Cancer Screening: (Low Dose CT Chest recommended if Age 43-80 years, 20 pack-year currently smoking OR have quit w/in 15years.) does not qualify.   Lung Cancer Screening Referral: n/a  Additional Screening:  Hepatitis C Screening: does qualify;  Vision Screening: Recommended annual ophthalmology exams for early detection of glaucoma and other disorders of the eye. Is the patient up to date with their annual eye exam?  Yes  Who is the provider or what is the name of the office in which the patient attends annual eye exams? [facility] If pt is not established with a provider, would they like to be referred to a provider to establish care? No .   Dental Screening: Recommended annual dental exams for proper oral hygiene  Diabetic Foot  Exam: Diabetic Foot Exam: Overdue, Pt has been advised about the importance in completing this exam. Pt is scheduled for diabetic foot exam on at next office visit.  Community Resource Referral / Chronic Care Management: CRR required this visit?  No   CCM required this visit?  No    Plan:     I have personally reviewed and noted the following in the patient's chart:   Medical and social history Use of alcohol, tobacco or illicit drugs  Current medications and supplements including opioid prescriptions. Patient is not currently taking opioid prescriptions. Functional ability and status Nutritional status Physical activity Advanced directives List of other physicians Hospitalizations, surgeries, and ER visits in previous 12 months Vitals Screenings to include cognitive, depression, and falls Referrals and appointments  In addition, I have reviewed and discussed with patient certain preventive protocols, quality metrics, and best practice recommendations. A written personalized care plan for preventive services as well as general preventive health recommendations were provided to patient.     Kandis Fantasia [place], [place]   [id number]   Nurse Notes: No concerns at this time

## 2023-03-07 DIAGNOSIS — E119 Type 2 diabetes mellitus without complications: Secondary | ICD-10-CM | POA: Diagnosis not present

## 2023-03-13 ENCOUNTER — Other Ambulatory Visit: Payer: Self-pay | Admitting: Family Medicine

## 2023-03-15 NOTE — Telephone Encounter (Signed)
Requested Prescriptions  Pending Prescriptions Disp Refills   OZEMPIC, 1 MG/DOSE, 4 MG/3ML SOPN [Pharmacy Med Name: OZEMPIC 1MG  PER DOSE (4MG /3ML) PFP] 3 mL 1    Sig: INJECT 1MG  UNDER THE SKIN ONCE WEEKLY     Endocrinology:  Diabetes - GLP-1 Receptor Agonists - semaglutide Failed - 03/13/2023 11:03 AM      Failed - HBA1C in normal range and within 180 days    Hgb A1C (fingerstick)  Date Value Ref Range Status  12/28/2014 6.7 (H) <5.7 % Final    Comment:                                                                           According to the ADA Clinical Practice Recommendations for 2011, when HbA1c is used as a screening test:     >=6.5%   Diagnostic of Diabetes Mellitus            (if abnormal result is confirmed)   5.7-6.4%   Increased risk of developing Diabetes Mellitus   References:Diagnosis and Classification of Diabetes Mellitus,Diabetes Care,2011,34(Suppl 1):S62-S69 and Standards of Medical Care in         Diabetes - 2011,Diabetes Care,2011,34 (Suppl 1):S11-S61.      Hgb A1c MFr Bld  Date Value Ref Range Status  06/03/2022 7.0 (H) <5.7 % of total Hgb Final    Comment:    For someone without known diabetes, a hemoglobin A1c value of 6.5% or greater indicates that they may have  diabetes and this should be confirmed with a follow-up  test. . For someone with known diabetes, a value <7% indicates  that their diabetes is well controlled and a value  greater than or equal to 7% indicates suboptimal  control. A1c targets should be individualized based on  duration of diabetes, age, comorbid conditions, and  other considerations. . Currently, no consensus exists regarding use of hemoglobin A1c for diagnosis of diabetes for children. .          Failed - Valid encounter within last 6 months    Recent Outpatient Visits           1 year ago Controlled type 2 diabetes mellitus with complication, without long-term current use of insulin (HCC)   [facility] Family  Medicine Pickard, Priscille Heidelberg, MD   2 years ago Allergic cough   [facility] Family Medicine Donita Brooks, MD   2 years ago Diabetes mellitus without complication (HCC)   [facility] Donita Brooks, MD   3 years ago Inflamed sebaceous cyst   [facility] Family Medicine Donita Brooks, MD   3 years ago Diabetes mellitus without complication [facility])   [facility] Pickard, Priscille Heidelberg, MD       Future Appointments             In 3 months Pickard, Priscille Heidelberg, MD [facility]            Passed - Cr in normal range and within 360 days    Creat  Date Value Ref Range Status  06/03/2022 1.05 0.70 - 1.35 mg/dL Final   Creatinine, Urine  Date Value Ref Range Status  06/03/2022 84 20 - 320 mg/dL Final

## 2023-04-04 ENCOUNTER — Other Ambulatory Visit: Payer: Self-pay | Admitting: Family Medicine

## 2023-04-04 DIAGNOSIS — E118 Type 2 diabetes mellitus with unspecified complications: Secondary | ICD-10-CM

## 2023-04-05 NOTE — Telephone Encounter (Signed)
Requested Prescriptions  Pending Prescriptions Disp Refills   metFORMIN (GLUCOPHAGE-XR) 500 MG 24 hr tablet [Pharmacy Med Name: METFORMIN ER 500MG  24HR TABS] 360 tablet 0    Sig: TAKE 4 TABLETS(2000 MG) BY MOUTH DAILY WITH BREAKFAST     Endocrinology:  Diabetes - Biguanides Failed - 04/04/2023  7:43 PM      Failed - HBA1C is between 0 and 7.9 and within 180 days    Hgb A1C (fingerstick)  Date Value Ref Range Status  12/28/2014 6.7 (H) <5.7 % Final    Comment:                                                                           According to the ADA Clinical Practice Recommendations for 2011, when HbA1c is used as a screening test:     >=6.5%   Diagnostic of Diabetes Mellitus            (if abnormal result is confirmed)   5.7-6.4%   Increased risk of developing Diabetes Mellitus   References:Diagnosis and Classification of Diabetes Mellitus,Diabetes Care,2011,34(Suppl 1):S62-S69 and Standards of Medical Care in         Diabetes - 2011,Diabetes Care,2011,34 (Suppl 1):S11-S61.      Hgb A1c MFr Bld  Date Value Ref Range Status  06/03/2022 7.0 (H) <5.7 % of total Hgb Final    Comment:    For someone without known diabetes, a hemoglobin A1c value of 6.5% or greater indicates that they may have  diabetes and this should be confirmed with a follow-up  test. . For someone with known diabetes, a value <7% indicates  that their diabetes is well controlled and a value  greater than or equal to 7% indicates suboptimal  control. A1c targets should be individualized based on  duration of diabetes, age, comorbid conditions, and  other considerations. . Currently, no consensus exists regarding use of hemoglobin A1c for diagnosis of diabetes for children. .          Failed - B12 Level in normal range and within 720 days    No results found for: "VITAMINB12"       Failed - Valid encounter within last 6 months    Recent Outpatient Visits           2 years ago Controlled type 2  diabetes mellitus with complication, without long-term current use of insulin (HCC)   [facility] Pickard, Priscille Heidelberg, MD   2 years ago Allergic cough   [facility] Family Medicine Donita Brooks, MD   3 years ago Diabetes mellitus without complication [facility])   [facility] Family Medicine Donita Brooks, MD   3 years ago Inflamed sebaceous cyst   [facility] Donita Brooks, MD   3 years ago Diabetes mellitus without complication [facility])   [facility] Pickard, Priscille Heidelberg, MD       Future Appointments             In 2 months Pickard, Priscille Heidelberg, MD [facility]            Passed - Cr in normal range and within 360 days  Creat  Date Value Ref Range Status  06/03/2022 1.05 0.70 - 1.35 mg/dL Final   Creatinine, Urine  Date Value Ref Range Status  06/03/2022 84 20 - 320 mg/dL Final         Passed - eGFR in normal range and within 360 days    GFR, Est African American  Date Value Ref Range Status  03/25/2020 110 > OR = 60 mL/min/1.24m2 Final   GFR, Est Non African American  Date Value Ref Range Status  03/25/2020 95 > OR = 60 mL/min/1.12m2 Final   eGFR  Date Value Ref Range Status  06/03/2022 79 > OR = 60 mL/min/1.45m2 Final         Passed - CBC within normal limits and completed in the last 12 months    WBC  Date Value Ref Range Status  06/03/2022 6.2 3.8 - 10.8 Thousand/uL Final   RBC  Date Value Ref Range Status  06/03/2022 5.60 4.20 - 5.80 Million/uL Final   Hemoglobin  Date Value Ref Range Status  06/03/2022 17.5 (H) 13.2 - 17.1 g/dL Final   HCT  Date Value Ref Range Status  06/03/2022 49.8 38.5 - 50.0 % Final   MCHC  Date Value Ref Range Status  06/03/2022 35.1 32.0 - 36.0 g/dL Final   [facility]  Date Value Ref Range Status  06/03/2022 31.3 27.0 - 33.0 pg Final   MCV  Date Value Ref Range Status  06/03/2022 88.9 80.0 - 100.0 fL Final   No results found for:  "PLTCOUNTKUC", "LABPLAT", "POCPLA" RDW  Date Value Ref Range Status  06/03/2022 12.9 11.0 - 15.0 % Final

## 2023-04-06 DIAGNOSIS — E119 Type 2 diabetes mellitus without complications: Secondary | ICD-10-CM | POA: Diagnosis not present

## 2023-04-21 ENCOUNTER — Other Ambulatory Visit: Payer: Self-pay | Admitting: Family Medicine

## 2023-04-21 DIAGNOSIS — E7800 Pure hypercholesterolemia, unspecified: Secondary | ICD-10-CM

## 2023-04-21 NOTE — Telephone Encounter (Signed)
Requested Prescriptions  Pending Prescriptions Disp Refills   rosuvastatin (CRESTOR) 10 MG tablet [Pharmacy Med Name: ROSUVASTATIN 10MG  TABLETS] 90 tablet 0    Sig: TAKE 1 TABLET(10 MG) BY MOUTH DAILY     Cardiovascular:  Antilipid - Statins 2 Failed - 04/21/2023  6:21 AM      Failed - Valid encounter within last 12 months    Recent Outpatient Visits           2 years ago Controlled type 2 diabetes mellitus with complication, without long-term current use of insulin (HCC)   [facility] Pickard, Priscille Heidelberg, MD   2 years ago Allergic cough   [facility] Medicine Donita Brooks, MD   3 years ago Diabetes mellitus without complication (HCC)   [facility] Family Medicine Pickard, Priscille Heidelberg, MD   3 years ago Inflamed sebaceous cyst   [facility] Medicine Donita Brooks, MD   3 years ago Diabetes mellitus without complication [facility])   [facility] Pickard, Priscille Heidelberg, MD       Future Appointments             In 1 month Pickard, Priscille Heidelberg, MD [facility]            Failed - Lipid Panel in normal range within the last 12 months    Cholesterol  Date Value Ref Range Status  06/03/2022 122 <200 mg/dL Final   LDL Cholesterol (Calc)  Date Value Ref Range Status  06/03/2022 48 mg/dL (calc) Final    Comment:    Reference range: <100 . Desirable range <100 mg/dL for primary prevention;   <70 mg/dL for patients with CHD or diabetic patients  with > or = 2 CHD risk factors. Marland Kitchen LDL-C is now calculated using the Martin-Hopkins  calculation, which is a validated novel method providing  better accuracy than the Friedewald equation in the  estimation of LDL-C.  Horald Pollen et al. Lenox Ahr. 2130;865(78): 2061-2068  (http://education.[employer].com/faq/FAQ164)    HDL  Date Value Ref Range Status  06/03/2022 54 > OR = 40 mg/dL Final   Triglycerides  Date Value Ref Range Status  06/03/2022 118 <150  mg/dL Final         Passed - Cr in normal range and within 360 days    Creat  Date Value Ref Range Status  06/03/2022 1.05 0.70 - 1.35 mg/dL Final   Creatinine, Urine  Date Value Ref Range Status  06/03/2022 84 20 - 320 mg/dL Final         Passed - Patient is not pregnant

## 2023-05-02 ENCOUNTER — Other Ambulatory Visit: Payer: Self-pay | Admitting: Family Medicine

## 2023-05-02 DIAGNOSIS — M1990 Unspecified osteoarthritis, unspecified site: Secondary | ICD-10-CM

## 2023-05-04 NOTE — Telephone Encounter (Signed)
Requested medication (s) are due for refill today: Yes  Requested medication (s) are on the active medication list: Yes  Last refill:  01/04/23/  Future visit scheduled: Yes  Notes to clinic:  Manual review.    Requested Prescriptions  Pending Prescriptions Disp Refills   celecoxib (CELEBREX) 100 MG capsule [Pharmacy Med Name: CELECOXIB 100MG  CAPSULES] 30 capsule 0    Sig: TAKE 1 CAPSULE BY MOUTH DAILY     Analgesics:  COX2 Inhibitors Failed - 05/02/2023  5:11 PM      Failed - Manual Review: Labs are only required if the patient has taken medication for more than 8 weeks.      Failed - HGB in normal range and within 360 days    Hemoglobin  Date Value Ref Range Status  06/03/2022 17.5 (H) 13.2 - 17.1 g/dL Final         Failed - Valid encounter within last 12 months    Recent Outpatient Visits           2 years ago Controlled type 2 diabetes mellitus with complication, without long-term current use of insulin (HCC)   [facility] Pickard, Priscille Heidelberg, MD   2 years ago Allergic cough   [facility] Tanya Nones, Priscille Heidelberg, MD   3 years ago Diabetes mellitus without complication (HCC)   [facility] Donita Brooks, MD   3 years ago Inflamed sebaceous cyst   [facility] Donita Brooks, MD   3 years ago Diabetes mellitus without complication (HCC)   [facility] Pickard, Priscille Heidelberg, MD       Future Appointments             In 1 month Pickard, Priscille Heidelberg, MD [facility]            Passed - Cr in normal range and within 360 days    Creat  Date Value Ref Range Status  06/03/2022 1.05 0.70 - 1.35 mg/dL Final   Creatinine, Urine  Date Value Ref Range Status  06/03/2022 84 20 - 320 mg/dL Final         Passed - HCT in normal range and within 360 days    HCT  Date Value Ref Range Status  06/03/2022 49.8 38.5 - 50.0 % Final         Passed - AST in normal  range and within 360 days    AST  Date Value Ref Range Status  06/03/2022 19 10 - 35 U/L Final         Passed - ALT in normal range and within 360 days    ALT  Date Value Ref Range Status  06/03/2022 24 9 - 46 U/L Final         Passed - eGFR is 30 or above and within 360 days    GFR, Est African American  Date Value Ref Range Status  03/25/2020 110 > OR = 60 mL/min/1.81m2 Final   GFR, Est Non African American  Date Value Ref Range Status  03/25/2020 95 > OR = 60 mL/min/1.72m2 Final   eGFR  Date Value Ref Range Status  06/03/2022 79 > OR = 60 mL/min/1.23m2 Final         Passed - Patient is not pregnant

## 2023-05-07 DIAGNOSIS — E119 Type 2 diabetes mellitus without complications: Secondary | ICD-10-CM | POA: Diagnosis not present

## 2023-05-08 ENCOUNTER — Other Ambulatory Visit: Payer: Self-pay | Admitting: Family Medicine

## 2023-05-09 ENCOUNTER — Encounter: Payer: Self-pay | Admitting: Family Medicine

## 2023-05-10 ENCOUNTER — Other Ambulatory Visit: Payer: Self-pay

## 2023-05-10 DIAGNOSIS — M1990 Unspecified osteoarthritis, unspecified site: Secondary | ICD-10-CM

## 2023-05-10 NOTE — Telephone Encounter (Signed)
Requested Prescriptions  Pending Prescriptions Disp Refills   Semaglutide, 1 MG/DOSE, (OZEMPIC, 1 MG/DOSE,) 4 MG/3ML SOPN [Pharmacy Med Name: OZEMPIC 1MG  PER DOSE (4MG /3ML) PFP] 3 mL 0    Sig: INJECT 1MG  UNDER THE SKIN ONCE WEEKLY     Endocrinology:  Diabetes - GLP-1 Receptor Agonists - semaglutide Failed - 05/08/2023  5:51 PM      Failed - HBA1C in normal range and within 180 days    Hgb A1C (fingerstick)  Date Value Ref Range Status  12/28/2014 6.7 (H) <5.7 % Final    Comment:                                                                           According to the ADA Clinical Practice Recommendations for 2011, when HbA1c is used as a screening test:     >=6.5%   Diagnostic of Diabetes Mellitus            (if abnormal result is confirmed)   5.7-6.4%   Increased risk of developing Diabetes Mellitus   References:Diagnosis and Classification of Diabetes Mellitus,Diabetes Care,2011,34(Suppl 1):S62-S69 and Standards of Medical Care in         Diabetes - 2011,Diabetes Care,2011,34 (Suppl 1):S11-S61.      Hgb A1c MFr Bld  Date Value Ref Range Status  06/03/2022 7.0 (H) <5.7 % of total Hgb Final    Comment:    For someone without known diabetes, a hemoglobin A1c value of 6.5% or greater indicates that they may have  diabetes and this should be confirmed with a follow-up  test. . For someone with known diabetes, a value <7% indicates  that their diabetes is well controlled and a value  greater than or equal to 7% indicates suboptimal  control. A1c targets should be individualized based on  duration of diabetes, age, comorbid conditions, and  other considerations. . Currently, no consensus exists regarding use of hemoglobin A1c for diagnosis of diabetes for children. .          Failed - Valid encounter within last 6 months    Recent Outpatient Visits           2 years ago Controlled type 2 diabetes mellitus with complication, without long-term current use of insulin  (HCC)   [facility] Pickard, Priscille Heidelberg, MD   2 years ago Allergic cough   [facility] Donita Brooks, MD   3 years ago Diabetes mellitus without complication (HCC)   [facility] Donita Brooks, MD   3 years ago Inflamed sebaceous cyst   [facility] Donita Brooks, MD   3 years ago Diabetes mellitus without complication [facility])   [facility] Pickard, Priscille Heidelberg, MD       Future Appointments             In 1 month Pickard, Priscille Heidelberg, MD [facility]            Passed - Cr in normal range and within 360 days    Creat  Date Value Ref Range Status  06/03/2022 1.05 0.70 - 1.35 mg/dL Final   Creatinine, Urine  Date  Value Ref Range Status  06/03/2022 84 20 - 320 mg/dL Final

## 2023-05-11 ENCOUNTER — Encounter: Payer: Self-pay | Admitting: Family Medicine

## 2023-05-12 ENCOUNTER — Other Ambulatory Visit: Payer: Self-pay

## 2023-05-12 DIAGNOSIS — M1990 Unspecified osteoarthritis, unspecified site: Secondary | ICD-10-CM

## 2023-05-12 MED ORDER — CELECOXIB 100 MG PO CAPS: ORAL_CAPSULE | ORAL | 2 refills | Status: DC

## 2023-06-01 ENCOUNTER — Other Ambulatory Visit: Payer: Self-pay | Admitting: Family Medicine

## 2023-06-01 NOTE — Telephone Encounter (Signed)
Requested Prescriptions  Pending Prescriptions Disp Refills   OZEMPIC, 1 MG/DOSE, 4 MG/3ML SOPN [Pharmacy Med Name: OZEMPIC 1MG  PER DOSE (4MG /3ML) PFP] 3 mL 0    Sig: INJECT 1MG  UNDER THE SKIN ONCE WEEKLY     Endocrinology:  Diabetes - GLP-1 Receptor Agonists - semaglutide Failed - 06/01/2023  9:44 AM      Failed - HBA1C in normal range and within 180 days    Hgb A1C (fingerstick)  Date Value Ref Range Status  12/28/2014 6.7 (H) <5.7 % Final    Comment:                                                                           According to the ADA Clinical Practice Recommendations for 2011, when HbA1c is used as a screening test:     >=6.5%   Diagnostic of Diabetes Mellitus            (if abnormal result is confirmed)   5.7-6.4%   Increased risk of developing Diabetes Mellitus   References:Diagnosis and Classification of Diabetes Mellitus,Diabetes Care,2011,34(Suppl 1):S62-S69 and Standards of Medical Care in         Diabetes - 2011,Diabetes Care,2011,34 (Suppl 1):S11-S61.      Hgb A1c MFr Bld  Date Value Ref Range Status  06/03/2022 7.0 (H) <5.7 % of total Hgb Final    Comment:    For someone without known diabetes, a hemoglobin A1c value of 6.5% or greater indicates that they may have  diabetes and this should be confirmed with a follow-up  test. . For someone with known diabetes, a value <7% indicates  that their diabetes is well controlled and a value  greater than or equal to 7% indicates suboptimal  control. A1c targets should be individualized based on  duration of diabetes, age, comorbid conditions, and  other considerations. . Currently, no consensus exists regarding use of hemoglobin A1c for diagnosis of diabetes for children. .          Failed - Cr in normal range and within 360 days    Creat  Date Value Ref Range Status  06/03/2022 1.05 0.70 - 1.35 mg/dL Final   Creatinine, Urine  Date Value Ref Range Status  06/03/2022 84 20 - 320 mg/dL Final          Failed - Valid encounter within last 6 months    Recent Outpatient Visits           2 years ago Controlled type 2 diabetes mellitus with complication, without long-term current use of insulin (HCC)   [facility] Pickard, Priscille Heidelberg, MD   3 years ago Allergic cough   [facility] Donita Brooks, MD   3 years ago Diabetes mellitus without complication [facility])   [facility] Donita Brooks, MD   3 years ago Inflamed sebaceous cyst   [facility] Donita Brooks, MD   3 years ago Diabetes mellitus without complication (HCC)   [facility] Pickard, Priscille Heidelberg, MD       Future Appointments             In 2 weeks Pickard, Priscille Heidelberg, MD [facility]

## 2023-06-06 DIAGNOSIS — E119 Type 2 diabetes mellitus without complications: Secondary | ICD-10-CM | POA: Diagnosis not present

## 2023-06-10 ENCOUNTER — Telehealth: Payer: Self-pay

## 2023-06-10 DIAGNOSIS — J302 Other seasonal allergic rhinitis: Secondary | ICD-10-CM

## 2023-06-10 NOTE — Telephone Encounter (Signed)
Copied from CRM [id number]. Topic: Clinical - Request for Lab/Test Order >> Jun 10, 2023  2:00 PM Dennison Nancy wrote: Reason for CRM: Patient requesting to get labs done on 06/11/23 reason he has a physcial appointment on 06/17/23 and want to have the blood done in advance

## 2023-06-11 ENCOUNTER — Other Ambulatory Visit (INDEPENDENT_AMBULATORY_CARE_PROVIDER_SITE_OTHER): Payer: Medicare Other

## 2023-06-11 DIAGNOSIS — Z125 Encounter for screening for malignant neoplasm of prostate: Secondary | ICD-10-CM | POA: Diagnosis not present

## 2023-06-11 DIAGNOSIS — E7800 Pure hypercholesterolemia, unspecified: Secondary | ICD-10-CM | POA: Diagnosis not present

## 2023-06-11 DIAGNOSIS — I10 Essential (primary) hypertension: Secondary | ICD-10-CM

## 2023-06-11 DIAGNOSIS — Z23 Encounter for immunization: Secondary | ICD-10-CM

## 2023-06-11 DIAGNOSIS — E118 Type 2 diabetes mellitus with unspecified complications: Secondary | ICD-10-CM

## 2023-06-12 LAB — COMPLETE METABOLIC PANEL WITH GFR
AG Ratio: 2 (calc) (ref 1.0–2.5)
ALT: 24 U/L (ref 9–46)
AST: 20 U/L (ref 10–35)
Albumin: 4.5 g/dL (ref 3.6–5.1)
Alkaline phosphatase (APISO): 72 U/L (ref 35–144)
BUN: 17 mg/dL (ref 7–25)
CO2: 27 mmol/L (ref 20–32)
Calcium: 9.6 mg/dL (ref 8.6–10.3)
Chloride: 103 mmol/L (ref 98–110)
Creat: 1.03 mg/dL (ref 0.70–1.35)
Globulin: 2.3 g/dL (ref 1.9–3.7)
Glucose, Bld: 128 mg/dL — ABNORMAL HIGH (ref 65–99)
Potassium: 4.2 mmol/L (ref 3.5–5.3)
Sodium: 138 mmol/L (ref 135–146)
Total Bilirubin: 0.5 mg/dL (ref 0.2–1.2)
Total Protein: 6.8 g/dL (ref 6.1–8.1)
eGFR: 80 mL/min/{1.73_m2} (ref >=60)

## 2023-06-12 LAB — CBC WITH DIFFERENTIAL/PLATELET
Absolute Lymphocytes: 1003 {cells}/uL (ref 850–3900)
Absolute Monocytes: 405 {cells}/uL (ref 200–950)
Basophils Absolute: 51 {cells}/uL (ref 0–200)
Basophils Relative: 1.1 %
Eosinophils Absolute: 313 {cells}/uL (ref 15–500)
Eosinophils Relative: 6.8 %
HCT: 47.8 % (ref 38.5–50.0)
Hemoglobin: 15.9 g/dL (ref 13.2–17.1)
MCH: 30.5 pg (ref 27.0–33.0)
MCHC: 33.3 g/dL (ref 32.0–36.0)
MCV: 91.6 fL (ref 80.0–100.0)
MPV: 9.7 fL (ref 7.5–12.5)
Monocytes Relative: 8.8 %
Neutro Abs: 2829 {cells}/uL (ref 1500–7800)
Neutrophils Relative %: 61.5 %
Platelets: 210 10*3/uL (ref 140–400)
RBC: 5.22 10*6/uL (ref 4.20–5.80)
RDW: 12.6 % (ref 11.0–15.0)
Total Lymphocyte: 21.8 %
WBC: 4.6 10*3/uL (ref 3.8–10.8)

## 2023-06-12 LAB — HEMOGLOBIN A1C
Hgb A1c MFr Bld: 6.1 %{Hb} — ABNORMAL HIGH (ref <5.7)
Mean Plasma Glucose: 128 mg/dL
eAG (mmol/L): 7.1 mmol/L

## 2023-06-12 LAB — LIPID PANEL
Cholesterol: 101 mg/dL (ref <200)
HDL: 46 mg/dL (ref >=40)
LDL Cholesterol (Calc): 42 mg/dL
Non-HDL Cholesterol (Calc): 55 mg/dL (ref <130)
Total CHOL/HDL Ratio: 2.2 (calc) (ref <5.0)
Triglycerides: 58 mg/dL (ref <150)

## 2023-06-12 LAB — PSA: PSA: 0.48 ng/mL (ref <=4.00)

## 2023-06-12 LAB — MICROALBUMIN / CREATININE URINE RATIO
Creatinine, Urine: 102 mg/dL (ref 20–320)
Microalb Creat Ratio: 13 mg/g{creat} (ref <30)
Microalb, Ur: 1.3 mg/dL

## 2023-06-12 LAB — VITAMIN B12: Vitamin B-12: 516 pg/mL (ref 200–1100)

## 2023-06-17 ENCOUNTER — Ambulatory Visit (INDEPENDENT_AMBULATORY_CARE_PROVIDER_SITE_OTHER): Payer: Medicare Other | Admitting: Family Medicine

## 2023-06-17 ENCOUNTER — Encounter: Payer: Self-pay | Admitting: Family Medicine

## 2023-06-17 VITALS — BP 130/78 | HR 75 | Temp 98.6°F | Ht 70.0 in | Wt 247.4 lb

## 2023-06-17 DIAGNOSIS — E7800 Pure hypercholesterolemia, unspecified: Secondary | ICD-10-CM | POA: Diagnosis not present

## 2023-06-17 DIAGNOSIS — E118 Type 2 diabetes mellitus with unspecified complications: Secondary | ICD-10-CM | POA: Diagnosis not present

## 2023-06-17 DIAGNOSIS — Z7984 Long term (current) use of oral hypoglycemic drugs: Secondary | ICD-10-CM

## 2023-06-17 DIAGNOSIS — I10 Essential (primary) hypertension: Secondary | ICD-10-CM | POA: Diagnosis not present

## 2023-06-17 DIAGNOSIS — Z0001 Encounter for general adult medical examination with abnormal findings: Secondary | ICD-10-CM

## 2023-06-17 DIAGNOSIS — Z0000 Encounter for general adult medical examination without abnormal findings: Secondary | ICD-10-CM

## 2023-06-17 MED ORDER — AZELASTINE HCL 0.1 % NA SOLN: NASAL | 3 refills | Status: DC

## 2023-06-17 MED ORDER — CELECOXIB 200 MG PO CAPS: 200.0000 mg | ORAL_CAPSULE | Freq: Two times a day (BID) | ORAL | 5 refills | Status: DC

## 2023-06-17 NOTE — Progress Notes (Signed)
Subjective:    Patient ID: James Ponce, male    DOB: Feb 07, 1956, 67 y.o.   MRN: [id number] Patient is here today for a complete physical exam.  Overall he is doing well.  Reviewing his shot records, the patient has had both pneumonia shots, his shingles vaccine, his flu shot, and his tetanus shot.  He is due for a COVID shot as well as RSV.  We discussed this today and he politely declined these.  His last colonoscopy was in 26-Jun-2018.  This was normal.  He is not due again until Jun 26, 2028.  His father died from prostate cancer in his 9s.  The patient's PSA today is 0.43.  He has no history of smoking.  He denies any chest pain shortness of breath or dyspnea on exertion.  We discussed a coronary artery calcium score but at the present time he defers this.  His most recent lab work is listed below.  Diabetic eye exam is up-to-date and diabetic foot exam was performed today and is normal. Lab on 06/11/2023  Component Date Value Ref Range Status   Glucose, Bld 06/11/2023 128 (H)  65 - 99 mg/dL Final   Comment: .            Fasting reference interval . For someone without known diabetes, a glucose value >125 mg/dL indicates that they may have diabetes and this should be confirmed with a follow-up test. .    BUN 06/11/2023 17  7 - 25 mg/dL Final   Creat 40/98/1191 1.03  0.70 - 1.35 mg/dL Final   eGFR 47/82/9562 80  > OR = 60 mL/min/1.35m2 Final   BUN/Creatinine Ratio 06/11/2023 SEE NOTE:  6 - 22 (calc) Final   Comment:    Not Reported: BUN and Creatinine are within    reference range. .    Sodium 06/11/2023 138  135 - 146 mmol/L Final   Potassium 06/11/2023 4.2  3.5 - 5.3 mmol/L Final   Chloride 06/11/2023 103  98 - 110 mmol/L Final   CO2 06/11/2023 27  20 - 32 mmol/L Final   Calcium 06/11/2023 9.6  8.6 - 10.3 mg/dL Final   Total Protein 13/02/6577 6.8  6.1 - 8.1 g/dL Final   Albumin 46/96/2952 4.5  3.6 - 5.1 g/dL Final   Globulin 84/13/2440 2.3  1.9 - 3.7 g/dL (calc) Final   AG Ratio  06/11/2023 2.0  1.0 - 2.5 (calc) Final   Total Bilirubin 06/11/2023 0.5  0.2 - 1.2 mg/dL Final   Alkaline phosphatase (APISO) 06/11/2023 72  35 - 144 U/L Final   AST 06/11/2023 20  10 - 35 U/L Final   ALT 06/11/2023 24  9 - 46 U/L Final   WBC 06/11/2023 4.6  3.8 - 10.8 Thousand/uL Final   RBC 06/11/2023 5.22  4.20 - 5.80 Million/uL Final   Hemoglobin 06/11/2023 15.9  13.2 - 17.1 g/dL Final   HCT 05/02/2535 47.8  38.5 - 50.0 % Final   MCV 06/11/2023 91.6  80.0 - 100.0 fL Final   MCH 06/11/2023 30.5  27.0 - 33.0 pg Final   MCHC 06/11/2023 33.3  32.0 - 36.0 g/dL Final   Comment: For adults, a slight decrease in the calculated MCHC value (in the range of 30 to 32 g/dL) is most likely not clinically significant; however, it should be interpreted with caution in correlation with other red cell parameters and the patient's clinical condition.    RDW 06/11/2023 12.6  11.0 - 15.0 % Final  Platelets 06/11/2023 210  140 - 400 Thousand/uL Final   MPV 06/11/2023 9.7  7.5 - 12.5 fL Final   Neutro Abs 06/11/2023 2,829  1,500 - 7,800 cells/uL Final   Absolute Lymphocytes 06/11/2023 1,003  850 - 3,900 cells/uL Final   Absolute Monocytes 06/11/2023 405  200 - 950 cells/uL Final   Eosinophils Absolute 06/11/2023 313  15 - 500 cells/uL Final   Basophils Absolute 06/11/2023 51  0 - 200 cells/uL Final   Neutrophils Relative % 06/11/2023 61.5  % Final   Total Lymphocyte 06/11/2023 21.8  % Final   Monocytes Relative 06/11/2023 8.8  % Final   Eosinophils Relative 06/11/2023 6.8  % Final   Basophils Relative 06/11/2023 1.1  % Final   Hgb A1c MFr Bld 06/11/2023 6.1 (H)  <5.7 % of total Hgb Final   Comment: For someone without known diabetes, a hemoglobin  A1c value between 5.7% and 6.4% is consistent with prediabetes and should be confirmed with a  follow-up test. . For someone with known diabetes, a value <7% indicates that their diabetes is well controlled. A1c targets should be individualized based  on duration of diabetes, age, comorbid conditions, and other considerations. . This assay result is consistent with an increased risk of diabetes. . Currently, no consensus exists regarding use of hemoglobin A1c for diagnosis of diabetes for children. .    Mean Plasma Glucose 06/11/2023 128  mg/dL Final   eAG (mmol/L) 16/04/9603 7.1  mmol/L Final   Cholesterol 06/11/2023 101  <200 mg/dL Final   HDL 54/03/8118 46  > OR = 40 mg/dL Final   Triglycerides 14/78/2956 58  <150 mg/dL Final   LDL Cholesterol (Calc) 06/11/2023 42  mg/dL (calc) Final   Comment: Reference range: <100 . Desirable range <100 mg/dL for primary prevention;   <70 mg/dL for patients with CHD or diabetic patients  with > or = 2 CHD risk factors. Marland Kitchen LDL-C is now calculated using the Martin-Hopkins  calculation, which is a validated novel method providing  better accuracy than the Friedewald equation in the  estimation of LDL-C.  Horald Pollen et al. Lenox Ahr. 2130;865(78): 2061-2068  (http://education.[employer].com/faq/FAQ164)    Total CHOL/HDL Ratio 06/11/2023 2.2  <4.6 (calc) Final   Non-HDL Cholesterol (Calc) 06/11/2023 55  <130 mg/dL (calc) Final   Comment: For patients with diabetes plus 1 major ASCVD risk  factor, treating to a non-HDL-C goal of <100 mg/dL  (LDL-C of <96 mg/dL) is considered a therapeutic  option.    Creatinine, Urine 06/11/2023 102  20 - 320 mg/dL Final   Microalb, Ur 29/52/8413 1.3  mg/dL Final   Comment: Reference Range Not established    Microalb Creat Ratio 06/11/2023 13  <30 mg/g creat Final   Comment: . The ADA defines abnormalities in albumin excretion as follows: Marland Kitchen Albuminuria Category        Result (mg/g creatinine) . Normal to Mildly increased   <30 Moderately increased         30-299  Severely increased           > OR = 300 . The ADA recommends that at least two of three specimens collected within a 3-6 month period be abnormal before considering a patient to  be within a diagnostic category.    Vitamin B-12 06/11/2023 516  200 - 1,100 pg/mL Final   PSA 06/11/2023 0.48  < OR = 4.00 ng/mL Final   Comment: The total PSA value from this assay system is  standardized against  the WHO standard. The test  result will be approximately 20% lower when compared  to the equimolar-standardized total PSA (Beckman  Coulter). Comparison of serial PSA results should be  interpreted with this fact in mind. . This test was performed using the Siemens  chemiluminescent method. Values obtained from  different assay methods cannot be used interchangeably. PSA levels, regardless of value, should not be interpreted as absolute evidence of the presence or absence of disease.      Past Medical History:  Diagnosis Date   Arthritis    Diabetes mellitus type II, uncontrolled    Diverticulosis    Hyperlipidemia    Hypertension    No past surgical history on file. Current Outpatient Medications on File Prior to Visit  Medication Sig Dispense Refill   albuterol (VENTOLIN HFA) 108 (90 Base) MCG/ACT inhaler Inhale 1-2 puffs into the lungs every 6 (six) hours as needed for wheezing or shortness of breath. 8 g 0   aspirin EC 81 MG tablet Take 81 mg by mouth daily.     azelastine (ASTELIN) 0.1 % nasal spray Use 2 (two) sprays in each nostril once a day. 30 mL 3   benzonatate (TESSALON) 100 MG capsule Take 1 capsule (100 mg total) by mouth every 8 (eight) hours. 21 capsule 0   brompheniramine-pseudoephedrine-DM 30-2-10 MG/5ML syrup Take 5 mLs by mouth 4 (four) times daily as needed. 140 mL 0   celecoxib (CELEBREX) 100 MG capsule TAKE 1 CAPSULE BY MOUTH DAILY 30 capsule 2   dapagliflozin propanediol (FARXIGA) 10 MG TABS tablet TAKE 1 TABLET BY MOUTH EVERY DAY BEFORE BREAKFAST 90 tablet 3   doxazosin (CARDURA) 4 MG tablet Take 1 tablet (4 mg total) by mouth daily. 90 tablet 3   fish oil-omega-3 fatty acids 1000 MG capsule Take 1-2 capsules (1-2 g total) by mouth 2 (two)  times daily. 2 capsules in morning, and 1 in evening, daily 90 capsule 3   fluticasone (FLONASE) 50 MCG/ACT nasal spray SPRAY 2 SPRAYS INTO EACH NOSTRIL EVERY DAY 48 mL 2   Glucosamine-Chondroitin (GLUCOSAMINE CHONDR COMPLEX PO) Take 1 tablet by mouth 2 (two) times daily.     glucose blood test strip Use to check blood sugar twice daily.*Dispense Contour next test strips* 100 each 12   loratadine (CLARITIN) 10 MG tablet Take 1 tablet (10 mg total) by mouth daily. 30 tablet 11   metFORMIN (GLUCOPHAGE-XR) 500 MG 24 hr tablet TAKE 4 TABLETS(2000 MG) BY MOUTH DAILY WITH BREAKFAST 360 tablet 0   Multiple Vitamin (MULTIVITAMIN) tablet Take 1 tablet by mouth daily.     OZEMPIC, 1 MG/DOSE, 4 MG/3ML SOPN INJECT 1MG  UNDER THE SKIN ONCE WEEKLY 3 mL 0   promethazine-dextromethorphan (PROMETHAZINE-DM) 6.25-15 MG/5ML syrup Take 5 mLs by mouth 4 (four) times daily as needed for cough. 118 mL 0   rosuvastatin (CRESTOR) 10 MG tablet TAKE 1 TABLET(10 MG) BY MOUTH DAILY 90 tablet 0   sildenafil (VIAGRA) 100 MG tablet Take 0.5-1 tablets (50-100 mg total) by mouth daily as needed for erectile dysfunction. 4 tablet 14   valsartan-hydrochlorothiazide (DIOVAN-HCT) 320-25 MG tablet Take 1 tablet by mouth daily. 90 tablet 3   No current facility-administered medications on file prior to visit.   Allergies  Allergen Reactions   Lipitor [Atorvastatin] Other (See Comments)    Muscle cramps, cant walk, sluggish   Sulfa Antibiotics Itching and Rash   Social History   Socioeconomic History   Marital status: Married    Spouse name: Not  on file   Number of children: Not on file   Years of education: Not on file   Highest education level: Not on file  Occupational History   Not on file  Tobacco Use   Smoking status: Never   Smokeless tobacco: Current    Types: Chew  Vaping Use   Vaping status: Never Used  Substance and Sexual Activity   Alcohol use: No   Drug use: No   Sexual activity: Not on file  Other  Topics Concern   Not on file  Social History Narrative   Not on file   Social Drivers of Health   Financial Resource Strain: Low Risk  (02/25/2023)   Overall Financial Resource Strain (CARDIA)    Difficulty of Paying Living Expenses: Not hard at all  Food Insecurity: No Food Insecurity (02/25/2023)   Hunger Vital Sign    Worried About Running Out of Food in the Last Year: Never true    Ran Out of Food in the Last Year: Never true  Transportation Needs: No Transportation Needs (02/25/2023)   PRAPARE - Administrator, Civil Service (Medical): No    Lack of Transportation (Non-Medical): No  Physical Activity: Sufficiently Active (02/25/2023)   Exercise Vital Sign    Days of Exercise per Week: 5 days    Minutes of Exercise per Session: 60 min  Stress: No Stress Concern Present (02/25/2023)   [employer] of Occupational Health - Occupational Stress Questionnaire    Feeling of Stress : Not at all  Social Connections: Moderately Integrated (02/25/2023)   Social Connection and Isolation Panel [NHANES]    Frequency of Communication with Friends and Family: More than three times a week    Frequency of Social Gatherings with Friends and Family: Three times a week    Attends Religious Services: More than 4 times per year    Active Member of Clubs or Organizations: No    Attends Banker Meetings: Never    Marital Status: Married  Catering manager Violence: Not At Risk (02/25/2023)   Humiliation, Afraid, Rape, and Kick questionnaire    Fear of Current or Ex-Partner: No    Emotionally Abused: No    Physically Abused: No    Sexually Abused: No     Review of Systems  All other systems reviewed and are negative.      Objective:   Physical Exam Vitals reviewed.  Constitutional:      General: He is not in acute distress.    Appearance: Normal appearance. He is normal weight. He is not ill-appearing.  Cardiovascular:     Rate and Rhythm: Normal rate and regular  rhythm.     Pulses: Normal pulses.     Heart sounds: Normal heart sounds. No murmur heard.    No friction rub. No gallop.  Pulmonary:     Effort: Pulmonary effort is normal. No respiratory distress.     Breath sounds: Normal breath sounds. No wheezing, rhonchi or rales.  Musculoskeletal:     Right lower leg: No edema.     Left lower leg: No edema.  Neurological:     Mental Status: He is alert.     Physical exam is significant only for psoriasis especially on the skin of his right shin and right calf.  He also has some psoriasis in his umbilicus.      Assessment & Plan:  General medical exam  Pure hypercholesterolemia  Controlled type 2 diabetes mellitus with complication, without long-term  current use of insulin (HCC)  Benign essential HTN Patient's physical exam today is completely normal.  His A1c is outstanding at 6.1.  His LDL cholesterol is well below 100.  His PSA is outstanding.  Blood pressure today is excellent.  Immunizations are up-to-date.  Diabetic foot exam is normal.  Colon cancer screening is up-to-date.  Diabetic eye exam is up-to-date.  Regular anticipatory guidance is provided

## 2023-06-17 NOTE — Telephone Encounter (Signed)
Copied from CRM [id number]. Topic: Clinical - Medication Refill >> Jun 17, 2023  1:48 PM Orinda Kenner C wrote: Most Recent Primary Care Visit:  Provider: [facility]  Department[facility]  Visit Type: LAB VISIT  Date: 06/11/2023  Medication: azelastine (ASTELIN) 0.1 % nasal spray refill for the year, pt doesn't want to have to call back every month.   Has the patient contacted their pharmacy? Yes (Agent: If no, request that the patient contact the pharmacy for the refill. If patient does not wish to contact the pharmacy document the reason why and proceed with request.) (Agent: If yes, when and what did the pharmacy advise?)  Is this the correct pharmacy for this prescription? Yes If no, delete pharmacy and type the correct one.  This is the patient's preferred pharmacy:  [id number] - [place], [place] - [address]  [place] [address] Phone: [phone] Fax: [phone]   Has the prescription been filled recently?   Is the patient out of the medication? No  Has the patient been seen for an appointment in the last year OR does the patient have an upcoming appointment? Yes  Can we respond through MyChart? No, if any issues call [phone]  Agent: Please be advised that Rx refills may take up to 3 business days. We ask that you follow-up with your pharmacy.

## 2023-07-06 ENCOUNTER — Other Ambulatory Visit: Payer: Self-pay | Admitting: Family Medicine

## 2023-07-06 ENCOUNTER — Telehealth: Payer: Self-pay

## 2023-07-06 MED ORDER — OZEMPIC (1 MG/DOSE) 4 MG/3ML ~~LOC~~ SOPN: 1.0000 mg | PEN_INJECTOR | SUBCUTANEOUS | 1 refills | Status: DC

## 2023-07-06 NOTE — Telephone Encounter (Signed)
 Prescription Request  07/06/2023  LOV: 06/17/2023  What is the name of the medication or equipment? sildenafil  (VIAGRA ) 100 MG tablet   Have you contacted your pharmacy to request a refill? Yes   Which pharmacy would you like this sent to?  [id number] - [place], [place] - [address] AT [facility] [address] [place] [address] Phone: [phone] Fax: [phone]    Patient notified that their request is being sent to the clinical staff for review and that they should receive a response within 2 business days.   Please advise at [facility] [phone]

## 2023-07-06 NOTE — Telephone Encounter (Signed)
 Copied from CRM [id number]. Topic: Clinical - Medication Question >> Jul 06, 2023  3:06 PM Halina Maidens L wrote: Reason for CRM: [facility] [phone] wanted to know if we could [facility]

## 2023-07-06 NOTE — Telephone Encounter (Signed)
 Copied from CRM [id number]. Topic: Clinical - Prescription Issue >> Jul 06, 2023  9:33 AM James Ponce wrote: Reason for CRM: Pt is calling because he is needing refill on OZEMPIC , 1 MG/DOSE. Says he is out and due for next dose on Sunday. Pharmacy says they have sent over refill request and needing authorization.

## 2023-07-07 DIAGNOSIS — E119 Type 2 diabetes mellitus without complications: Secondary | ICD-10-CM | POA: Diagnosis not present

## 2023-07-08 ENCOUNTER — Telehealth: Payer: Self-pay | Admitting: Family Medicine

## 2023-07-08 DIAGNOSIS — K080 Exfoliation of teeth due to systemic causes: Secondary | ICD-10-CM | POA: Diagnosis not present

## 2023-07-08 NOTE — Telephone Encounter (Signed)
 LVM to pt to confirm scripts needed, one script was approved and sent to pharmacy. Per pt chart on 07/06/23 there was a script request from [facility] for a transfer of script for Ozempic  to their pharmacy, this script has been approved and sent by the office.The prescription request sent to nurse on 12/31 detailed a refill of sildenafil  (Viagra ) requested from [facility] and the corresponding note from agent for this request detailed about a request for semaglutide  (Ozempic ) from [place]. Need to confirm if refill for sildenafil  needed by pt. Nurse will call pt back in 30 min.

## 2023-07-08 NOTE — Telephone Encounter (Signed)
 Prescription Request  07/08/2023  LOV: 06/17/2023  What is the name of the medication or equipment? sildenafil  (VIAGRA ) 100 MG tablet   Have you contacted your pharmacy to request a refill? Yes   Which pharmacy would you like this sent to?  [id number] [place], [place] - [address] AT [facility] [address]  [place] [address] Phone: [phone] Fax: [phone]    Patient notified that their request is being sent to the clinical staff for review and that they should receive a response within 2 business days.   Please advise at [facility] [phone]

## 2023-07-08 NOTE — Telephone Encounter (Signed)
 LVM to pt for call back to confirm if request from [facility] for s[facility]enafil is needed. See previous note.

## 2023-07-09 ENCOUNTER — Other Ambulatory Visit: Payer: Self-pay

## 2023-07-09 DIAGNOSIS — L814 Other melanin hyperpigmentation: Secondary | ICD-10-CM | POA: Diagnosis not present

## 2023-07-09 DIAGNOSIS — L570 Actinic keratosis: Secondary | ICD-10-CM | POA: Diagnosis not present

## 2023-07-09 DIAGNOSIS — L821 Other seborrheic keratosis: Secondary | ICD-10-CM | POA: Diagnosis not present

## 2023-07-09 DIAGNOSIS — L905 Scar conditions and fibrosis of skin: Secondary | ICD-10-CM | POA: Diagnosis not present

## 2023-07-09 DIAGNOSIS — D225 Melanocytic nevi of trunk: Secondary | ICD-10-CM | POA: Diagnosis not present

## 2023-07-09 MED ORDER — SILDENAFIL CITRATE 100 MG PO TABS: 50.0000 mg | ORAL_TABLET | Freq: Every day | ORAL | 14 refills | Status: DC | PRN

## 2023-07-10 ENCOUNTER — Other Ambulatory Visit: Payer: Self-pay | Admitting: Family Medicine

## 2023-07-10 DIAGNOSIS — E118 Type 2 diabetes mellitus with unspecified complications: Secondary | ICD-10-CM

## 2023-07-10 DIAGNOSIS — E7800 Pure hypercholesterolemia, unspecified: Secondary | ICD-10-CM

## 2023-07-12 ENCOUNTER — Other Ambulatory Visit: Payer: Self-pay

## 2023-07-12 ENCOUNTER — Encounter: Payer: Self-pay | Admitting: Family Medicine

## 2023-07-12 DIAGNOSIS — I10 Essential (primary) hypertension: Secondary | ICD-10-CM

## 2023-07-12 DIAGNOSIS — E118 Type 2 diabetes mellitus with unspecified complications: Secondary | ICD-10-CM

## 2023-07-12 DIAGNOSIS — M1990 Unspecified osteoarthritis, unspecified site: Secondary | ICD-10-CM

## 2023-07-12 DIAGNOSIS — J302 Other seasonal allergic rhinitis: Secondary | ICD-10-CM

## 2023-07-12 DIAGNOSIS — E7800 Pure hypercholesterolemia, unspecified: Secondary | ICD-10-CM

## 2023-07-12 DIAGNOSIS — K080 Exfoliation of teeth due to systemic causes: Secondary | ICD-10-CM | POA: Diagnosis not present

## 2023-07-12 DIAGNOSIS — J329 Chronic sinusitis, unspecified: Secondary | ICD-10-CM

## 2023-07-12 MED ORDER — GLUCOSE BLOOD VI STRP: ORAL_STRIP | 12 refills | Status: AC

## 2023-07-12 MED ORDER — DAPAGLIFLOZIN PROPANEDIOL 10 MG PO TABS: ORAL_TABLET | ORAL | 3 refills | Status: DC

## 2023-07-12 MED ORDER — FLUTICASONE PROPIONATE 50 MCG/ACT NA SUSP: 2.0000 | Freq: Every day | NASAL | 2 refills | Status: AC

## 2023-07-12 MED ORDER — VALSARTAN-HYDROCHLOROTHIAZIDE 320-25 MG PO TABS: 1.0000 | ORAL_TABLET | Freq: Every day | ORAL | 3 refills | Status: DC

## 2023-07-12 MED ORDER — AZELASTINE HCL 0.1 % NA SOLN: NASAL | 3 refills | Status: DC

## 2023-07-12 MED ORDER — CELECOXIB 200 MG PO CAPS: 200.0000 mg | ORAL_CAPSULE | Freq: Two times a day (BID) | ORAL | 5 refills | Status: AC

## 2023-07-12 MED ORDER — SILDENAFIL CITRATE 100 MG PO TABS: 50.0000 mg | ORAL_TABLET | Freq: Every day | ORAL | 14 refills | Status: AC | PRN

## 2023-07-12 MED ORDER — OMEGA-3 FATTY ACIDS 1000 MG PO CAPS: 1.0000 g | ORAL_CAPSULE | Freq: Two times a day (BID) | ORAL | 3 refills | Status: AC

## 2023-07-12 MED ORDER — ROSUVASTATIN CALCIUM 10 MG PO TABS: 10.0000 mg | ORAL_TABLET | Freq: Every day | ORAL | 3 refills | Status: DC

## 2023-07-12 MED ORDER — DOXAZOSIN MESYLATE 4 MG PO TABS: 4.0000 mg | ORAL_TABLET | Freq: Every day | ORAL | 3 refills | Status: DC

## 2023-07-12 MED ORDER — METFORMIN HCL ER 500 MG PO TB24: 2000.0000 mg | ORAL_TABLET | Freq: Every day | ORAL | 2 refills | Status: DC

## 2023-08-07 DIAGNOSIS — E119 Type 2 diabetes mellitus without complications: Secondary | ICD-10-CM | POA: Diagnosis not present

## 2023-08-09 ENCOUNTER — Other Ambulatory Visit: Payer: Self-pay | Admitting: Family Medicine

## 2023-08-09 DIAGNOSIS — I10 Essential (primary) hypertension: Secondary | ICD-10-CM

## 2023-08-24 ENCOUNTER — Other Ambulatory Visit: Payer: Self-pay | Admitting: Family Medicine

## 2023-08-24 NOTE — Telephone Encounter (Signed)
 Last OV 06/17/23 Requested Prescriptions  Pending Prescriptions Disp Refills   OZEMPIC, 1 MG/DOSE, 4 MG/3ML SOPN [Pharmacy Med Name: Ozempic 1 mg/dose (4 mg/3 mL) subcutaneous pen injector] 3 mL 0    Sig: INJECT ONE MG INTO THE SKIN ONCE A WEEK.     Endocrinology:  Diabetes - GLP-1 Receptor Agonists - semaglutide Failed - 08/24/2023  3:16 PM      Failed - HBA1C in normal range and within 180 days    Hgb A1C (fingerstick)  Date Value Ref Range Status  12/28/2014 6.7 (H) <5.7 % Final    Comment:                                                                           According to the ADA Clinical Practice Recommendations for 2011, when HbA1c is used as a screening test:     >=6.5%   Diagnostic of Diabetes Mellitus            (if abnormal result is confirmed)   5.7-6.4%   Increased risk of developing Diabetes Mellitus   References:Diagnosis and Classification of Diabetes Mellitus,Diabetes Care,2011,34(Suppl 1):S62-S69 and Standards of Medical Care in         Diabetes - 2011,Diabetes Care,2011,34 (Suppl 1):S11-S61.      Hgb A1c MFr Bld  Date Value Ref Range Status  06/11/2023 6.1 (H) <5.7 % of total Hgb Final    Comment:    For someone without known diabetes, a hemoglobin  A1c value between 5.7% and 6.4% is consistent with prediabetes and should be confirmed with a  follow-up test. . For someone with known diabetes, a value <7% indicates that their diabetes is well controlled. A1c targets should be individualized based on duration of diabetes, age, comorbid conditions, and other considerations. . This assay result is consistent with an increased risk of diabetes. . Currently, no consensus exists regarding use of hemoglobin A1c for diagnosis of diabetes for children. .          Failed - Valid encounter within last 6 months    Recent Outpatient Visits           2 years ago Controlled type 2 diabetes mellitus with complication, without long-term current use of insulin  (HCC)   [facility] Pickard, Priscille Heidelberg, MD   3 years ago Allergic cough   [facility] Donita Brooks, MD   3 years ago Diabetes mellitus without complication [facility])   [facility] Pickard, Priscille Heidelberg, MD   3 years ago Inflamed sebaceous cyst   [facility] Donita Brooks, MD   4 years ago Diabetes mellitus without complication [facility])   [facility] Pickard, Priscille Heidelberg, MD              Passed - Cr in normal range and within 360 days    Creat  Date Value Ref Range Status  06/11/2023 1.03 0.70 - 1.35 mg/dL Final   Creatinine, Urine  Date Value Ref Range Status  06/11/2023 102 20 - 320 mg/dL Final

## 2023-09-04 DIAGNOSIS — E119 Type 2 diabetes mellitus without complications: Secondary | ICD-10-CM | POA: Diagnosis not present

## 2023-10-05 DIAGNOSIS — E119 Type 2 diabetes mellitus without complications: Secondary | ICD-10-CM | POA: Diagnosis not present

## 2023-11-01 DIAGNOSIS — H524 Presbyopia: Secondary | ICD-10-CM | POA: Diagnosis not present

## 2023-11-01 LAB — HM DIABETES EYE EXAM

## 2023-11-04 DIAGNOSIS — E119 Type 2 diabetes mellitus without complications: Secondary | ICD-10-CM | POA: Diagnosis not present

## 2023-11-18 ENCOUNTER — Other Ambulatory Visit: Payer: Self-pay | Admitting: Family Medicine

## 2023-11-22 NOTE — Telephone Encounter (Signed)
 Requested Prescriptions  Pending Prescriptions Disp Refills   OZEMPIC , 1 MG/DOSE, 4 MG/3ML SOPN [Pharmacy Med Name: Ozempic  1 mg/dose (4 mg/3 mL) subcutaneous pen injector] 9 mL 0    Sig: INJECT 1MG  INTO SKIN ONCE A WEEK     Endocrinology:  Diabetes - GLP-1 Receptor Agonists - semaglutide  Failed - 11/22/2023 10:24 AM      Failed - HBA1C in normal range and within 180 days    Hgb A1C (fingerstick)  Date Value Ref Range Status  12/28/2014 6.7 (H) <5.7 % Final    Comment:                                                                           According to the ADA Clinical Practice Recommendations for 2011, when HbA1c is used as a screening test:     >=6.5%   Diagnostic of Diabetes Mellitus            (if abnormal result is confirmed)   5.7-6.4%   Increased risk of developing Diabetes Mellitus   References:Diagnosis and Classification of Diabetes Mellitus,Diabetes Care,2011,34(Suppl 1):S62-S69 and Standards of Medical Care in         Diabetes - 2011,Diabetes Care,2011,34 (Suppl 1):S11-S61.      Hgb A1c MFr Bld  Date Value Ref Range Status  06/11/2023 6.1 (H) <5.7 % of total Hgb Final    Comment:    For someone without known diabetes, a hemoglobin  A1c value between 5.7% and 6.4% is consistent with prediabetes and should be confirmed with a  follow-up test. . For someone with known diabetes, a value <7% indicates that their diabetes is well controlled. A1c targets should be individualized based on duration of diabetes, age, comorbid conditions, and other considerations. . This assay result is consistent with an increased risk of diabetes. . Currently, no consensus exists regarding use of hemoglobin A1c for diagnosis of diabetes for children. .          Passed - Cr in normal range and within 360 days    Creat  Date Value Ref Range Status  06/11/2023 1.03 0.70 - 1.35 mg/dL Final   Creatinine, Urine  Date Value Ref Range Status  06/11/2023 102 20 - 320 mg/dL Final          Passed - Valid encounter within last 6 months    Recent Outpatient Visits           5 months ago General medical exam   [place] [facility] Austine Lefort, MD   1 year ago Acute non-recurrent frontal sinusitis   [place] [facility] Jenelle Mis, FNP   1 year ago Acute non-recurrent frontal sinusitis   [place] [facility] Austine Lefort, MD   1 year ago Controlled type 2 diabetes mellitus with complication, without long-term current use of insulin [facility])   [place] [facility] Austine Lefort, MD   1 year ago Controlled type 2 diabetes mellitus with complication, without long-term current use of insulin [facility])   [place] [facility] Pickard, Cisco Crest, MD

## 2023-12-01 ENCOUNTER — Telehealth: Payer: Self-pay

## 2023-12-01 NOTE — Telephone Encounter (Signed)
 Fax received from [place] requesting recent labs for continuity of care. Mjp,lpn

## 2023-12-05 DIAGNOSIS — E119 Type 2 diabetes mellitus without complications: Secondary | ICD-10-CM | POA: Diagnosis not present

## 2023-12-06 DIAGNOSIS — L400 Psoriasis vulgaris: Secondary | ICD-10-CM | POA: Diagnosis not present

## 2023-12-06 DIAGNOSIS — L821 Other seborrheic keratosis: Secondary | ICD-10-CM | POA: Diagnosis not present

## 2023-12-06 DIAGNOSIS — L570 Actinic keratosis: Secondary | ICD-10-CM | POA: Diagnosis not present

## 2023-12-06 DIAGNOSIS — L814 Other melanin hyperpigmentation: Secondary | ICD-10-CM | POA: Diagnosis not present

## 2023-12-06 DIAGNOSIS — D225 Melanocytic nevi of trunk: Secondary | ICD-10-CM | POA: Diagnosis not present

## 2023-12-10 ENCOUNTER — Other Ambulatory Visit: Payer: Medicare Other

## 2023-12-13 ENCOUNTER — Other Ambulatory Visit: Payer: Self-pay

## 2023-12-13 ENCOUNTER — Other Ambulatory Visit

## 2023-12-13 DIAGNOSIS — E118 Type 2 diabetes mellitus with unspecified complications: Secondary | ICD-10-CM | POA: Diagnosis not present

## 2023-12-13 DIAGNOSIS — E7800 Pure hypercholesterolemia, unspecified: Secondary | ICD-10-CM

## 2023-12-13 DIAGNOSIS — I10 Essential (primary) hypertension: Secondary | ICD-10-CM

## 2023-12-13 DIAGNOSIS — Z125 Encounter for screening for malignant neoplasm of prostate: Secondary | ICD-10-CM

## 2023-12-14 ENCOUNTER — Ambulatory Visit: Payer: Self-pay | Admitting: Family Medicine

## 2023-12-14 LAB — CBC WITH DIFFERENTIAL/PLATELET
Absolute Lymphocytes: 1267 {cells}/uL (ref 850–3900)
Absolute Monocytes: 472 {cells}/uL (ref 200–950)
Basophils Absolute: 58 {cells}/uL (ref 0–200)
Basophils Relative: 1.1 %
Eosinophils Absolute: 339 {cells}/uL (ref 15–500)
Eosinophils Relative: 6.4 %
HCT: 49.3 % (ref 38.5–50.0)
Hemoglobin: 16.4 g/dL (ref 13.2–17.1)
MCH: 30.8 pg (ref 27.0–33.0)
MCHC: 33.3 g/dL (ref 32.0–36.0)
MCV: 92.5 fL (ref 80.0–100.0)
MPV: 9.3 fL (ref 7.5–12.5)
Monocytes Relative: 8.9 %
Neutro Abs: 3164 {cells}/uL (ref 1500–7800)
Neutrophils Relative %: 59.7 %
Platelets: 219 10*3/uL (ref 140–400)
RBC: 5.33 10*6/uL (ref 4.20–5.80)
RDW: 13.1 % (ref 11.0–15.0)
Total Lymphocyte: 23.9 %
WBC: 5.3 10*3/uL (ref 3.8–10.8)

## 2023-12-14 LAB — COMPREHENSIVE METABOLIC PANEL WITH GFR
AG Ratio: 2 (calc) (ref 1.0–2.5)
ALT: 23 U/L (ref 9–46)
AST: 18 U/L (ref 10–35)
Albumin: 4.5 g/dL (ref 3.6–5.1)
Alkaline phosphatase (APISO): 96 U/L (ref 35–144)
BUN: 17 mg/dL (ref 7–25)
CO2: 26 mmol/L (ref 20–32)
Calcium: 9.8 mg/dL (ref 8.6–10.3)
Chloride: 103 mmol/L (ref 98–110)
Creat: 1.02 mg/dL (ref 0.70–1.35)
Globulin: 2.3 g/dL (ref 1.9–3.7)
Glucose, Bld: 138 mg/dL — ABNORMAL HIGH (ref 65–99)
Potassium: 4.3 mmol/L (ref 3.5–5.3)
Sodium: 140 mmol/L (ref 135–146)
Total Bilirubin: 0.3 mg/dL (ref 0.2–1.2)
Total Protein: 6.8 g/dL (ref 6.1–8.1)
eGFR: 81 mL/min/{1.73_m2} (ref >=60)

## 2023-12-14 LAB — PSA: PSA: 0.51 ng/mL (ref <=4.00)

## 2023-12-14 LAB — LIPID PANEL
Cholesterol: 114 mg/dL (ref <200)
HDL: 55 mg/dL (ref >=40)
LDL Cholesterol (Calc): 43 mg/dL
Non-HDL Cholesterol (Calc): 59 mg/dL (ref <130)
Total CHOL/HDL Ratio: 2.1 (calc) (ref <5.0)
Triglycerides: 80 mg/dL (ref <150)

## 2023-12-16 ENCOUNTER — Ambulatory Visit: Payer: Medicare Other | Admitting: Family Medicine

## 2023-12-16 VITALS — BP 148/68 | HR 76 | Temp 97.5°F | Resp 18 | Ht 70.0 in | Wt 249.0 lb

## 2023-12-16 DIAGNOSIS — Z7984 Long term (current) use of oral hypoglycemic drugs: Secondary | ICD-10-CM

## 2023-12-16 DIAGNOSIS — E118 Type 2 diabetes mellitus with unspecified complications: Secondary | ICD-10-CM

## 2023-12-16 MED ORDER — TIRZEPATIDE 10 MG/0.5ML ~~LOC~~ SOAJ: 10.0000 mg | SUBCUTANEOUS | 3 refills | Status: DC

## 2023-12-16 NOTE — Progress Notes (Signed)
 Subjective:    Patient ID: James Ponce, male    DOB: 03/21/1956, 68 y.o.   MRN: [id number] Patient is here today for a checkup.  His blood pressure is elevated however he rushing around this morning and a hurry to try to get here on time.  He denies any chest pain shortness of breath dyspnea on exertion.  His most recent lab work showed an excellent cholesterol.  Liver and kidney tests are normal he has not litters over 30.  He has not been checking his sugars consistently.  He denies any neuropathy in his feet.  A1c is pending. Past Medical History:  Diagnosis Date   Arthritis    Diabetes mellitus type II, uncontrolled    Diverticulosis    Hyperlipidemia    Hypertension    No past surgical history on file. Current Outpatient Medications on File Prior to Visit  Medication Sig Dispense Refill   albuterol  (VENTOLIN  HFA) 108 (90 Base) MCG/ACT inhaler Inhale 1-2 puffs into the lungs every 6 (six) hours as needed for wheezing or shortness of breath. (Patient not taking: Reported on 06/17/2023) 8 g 0   aspirin EC 81 MG tablet Take 81 mg by mouth daily.     azelastine  (ASTELIN ) 0.1 % nasal spray Use 2 (two) sprays in each nostril once a day. 30 mL 3   benzonatate  (TESSALON ) 100 MG capsule Take 1 capsule (100 mg total) by mouth every 8 (eight) hours. (Patient not taking: Reported on 06/17/2023) 21 capsule 0   brompheniramine-pseudoephedrine-DM 30-2-10 MG/5ML syrup Take 5 mLs by mouth 4 (four) times daily as needed. (Patient not taking: Reported on 06/17/2023) 140 mL 0   celecoxib  (CELEBREX ) 200 MG capsule Take 1 capsule (200 mg total) by mouth 2 (two) times daily. 60 capsule 5   dapagliflozin  propanediol (FARXIGA ) 10 MG TABS tablet TAKE 1 TABLET BY MOUTH EVERY DAY BEFORE BREAKFAST 90 tablet 3   doxazosin  (CARDURA ) 4 MG tablet Take 1 tablet (4 mg total) by mouth daily. 90 tablet 3   fish oil-omega-3 fatty acids  1000 MG capsule Take 1-2 capsules (1-2 g total) by mouth 2 (two) times daily. 2 capsules  in morning, and 1 in evening, daily 90 capsule 3   fluticasone  (FLONASE ) 50 MCG/ACT nasal spray Place 2 sprays into both nostrils daily. 48 mL 2   Glucosamine-Chondroitin (GLUCOSAMINE CHONDR COMPLEX PO) Take 1 tablet by mouth 2 (two) times daily.     glucose blood test strip Use to check blood sugar twice daily.*Dispense Contour next test strips* 100 each 12   loratadine  (CLARITIN ) 10 MG tablet Take 1 tablet (10 mg total) by mouth daily. 30 tablet 11   metFORMIN  (GLUCOPHAGE -XR) 500 MG 24 hr tablet Take 4 tablets (2,000 mg total) by mouth daily with breakfast. 360 tablet 2   Multiple Vitamin (MULTIVITAMIN) tablet Take 1 tablet by mouth daily.     OZEMPIC , 1 MG/DOSE, 4 MG/3ML SOPN INJECT 1MG  INTO SKIN ONCE A WEEK 9 mL 0   promethazine -dextromethorphan (PROMETHAZINE -DM) 6.25-15 MG/5ML syrup Take 5 mLs by mouth 4 (four) times daily as needed for cough. 118 mL 0   rosuvastatin  (CRESTOR ) 10 MG tablet Take 1 tablet (10 mg total) by mouth daily. 90 tablet 3   sildenafil  (VIAGRA ) 100 MG tablet Take 0.5-1 tablets (50-100 mg total) by mouth daily as needed for erectile dysfunction. 4 tablet 14   valsartan -hydrochlorothiazide  (DIOVAN -HCT) 320-25 MG tablet Take 1 tablet by mouth daily. 90 tablet 3   No current facility-administered medications on file  prior to visit.   Allergies  Allergen Reactions   Lipitor [Atorvastatin] Other (See Comments)    Muscle cramps, cant walk, sluggish   Sulfa Antibiotics Itching and Rash   Social History   Socioeconomic History   Marital status: Married    Spouse name: Not on file   Number of children: Not on file   Years of education: Not on file   Highest education level: Not on file  Occupational History   Not on file  Tobacco Use   Smoking status: Never   Smokeless tobacco: Current    Types: Chew  Vaping Use   Vaping status: Never Used  Substance and Sexual Activity   Alcohol use: No   Drug use: No   Sexual activity: Not on file  Other Topics Concern    Not on file  Social History Narrative   Not on file   Social Drivers of Health   Financial Resource Strain: Low Risk  (02/25/2023)   Overall Financial Resource Strain (CARDIA)    Difficulty of Paying Living Expenses: Not hard at all  Food Insecurity: No Food Insecurity (02/25/2023)   Hunger Vital Sign    Worried About Running Out of Food in the Last Year: Never true    Ran Out of Food in the Last Year: Never true  Transportation Needs: No Transportation Needs (02/25/2023)   PRAPARE - Administrator, Civil Service (Medical): No    Lack of Transportation (Non-Medical): No  Physical Activity: Sufficiently Active (02/25/2023)   Exercise Vital Sign    Days of Exercise per Week: 5 days    Minutes of Exercise per Session: 60 min  Stress: No Stress Concern Present (02/25/2023)   [employer] of Occupational Health - Occupational Stress Questionnaire    Feeling of Stress : Not at all  Social Connections: Moderately Integrated (02/25/2023)   Social Connection and Isolation Panel    Frequency of Communication with Friends and Family: More than three times a week    Frequency of Social Gatherings with Friends and Family: Three times a week    Attends Religious Services: More than 4 times per year    Active Member of Clubs or Organizations: No    Attends Banker Meetings: Never    Marital Status: Married  Catering manager Violence: Not At Risk (02/25/2023)   Humiliation, Afraid, Rape, and Kick questionnaire    Fear of Current or Ex-Partner: No    Emotionally Abused: No    Physically Abused: No    Sexually Abused: No     Review of Systems  All other systems reviewed and are negative.      Objective:   Physical Exam Vitals reviewed.  Constitutional:      General: He is not in acute distress.    Appearance: Normal appearance. He is normal weight. He is not ill-appearing.   Cardiovascular:     Rate and Rhythm: Normal rate and regular rhythm.     Pulses:  Normal pulses.     Heart sounds: Normal heart sounds. No murmur heard.    No friction rub. No gallop.  Pulmonary:     Effort: Pulmonary effort is normal. No respiratory distress.     Breath sounds: Normal breath sounds. No wheezing, rhonchi or rales.   Musculoskeletal:     Right lower leg: No edema.     Left lower leg: No edema.   Neurological:     Mental Status: He is alert.  Assessment & Plan:  Controlled type 2 diabetes mellitus with complication, without long-term current use of insulin (HCC) Decided to switch Ozempic  to Mounjaro 10 mg subcu weekly and uptitrate as tolerated to facilitate additional weight loss and help with sugar control.  Cholesterol is outstanding.  Blood pressure is elevated today so I will ask him to start checking it daily at home and if consistently greater than 140 we could either increase doxazosin  to 8 or add Bystolic

## 2023-12-31 ENCOUNTER — Ambulatory Visit: Payer: Self-pay

## 2023-12-31 ENCOUNTER — Other Ambulatory Visit: Payer: Self-pay | Admitting: Family Medicine

## 2023-12-31 ENCOUNTER — Telehealth: Payer: Self-pay

## 2023-12-31 MED ORDER — HYDROCODONE BIT-HOMATROP MBR 5-1.5 MG/5ML PO SOLN: 5.0000 mL | Freq: Three times a day (TID) | ORAL | 0 refills | Status: DC | PRN

## 2023-12-31 NOTE — Telephone Encounter (Signed)
 FYI Only or Action Required?: Action required by provider: requesting cough medication.  Patient was last seen in primary care on 12/16/2023 by [name] Butler DASEN, MD. Called Nurse Triage reporting Cough. Symptoms began several days ago. Interventions attempted: OTC medications: robitussin and Rest, hydration, or home remedies. Symptoms are: unchanged.  Triage Disposition: See Physician Within 24 Hours  Patient/caregiver understands and will follow disposition?: No, wishes to speak with PCP  Patient is calling bc of a cough that he has that will not allow him to sleep.  Reason for Disposition  [1] Continuous (nonstop) coughing interferes with work or school AND [2] no improvement using cough treatment per Care Advice  Answer Assessment - Initial Assessment Questions 1. ONSET: When did the cough begin?      Started a couple of days ago 2. SEVERITY: How bad is the cough today?      5-6 out of 10 in severity 3. SPUTUM: Describe the color of your sputum (none, dry cough; clear, white, yellow, green)     dry 4. HEMOPTYSIS: Are you coughing up any blood? If so ask: How much? (flecks, streaks, tablespoons, etc.)     no 5. DIFFICULTY BREATHING: Are you having difficulty breathing? If Yes, ask: How bad is it? (e.g., mild, moderate, severe)    - MILD: No SOB at rest, mild SOB with walking, speaks normally in sentences, can lie down, no retractions, pulse < 100.    - MODERATE: SOB at rest, SOB with minimal exertion and prefers to sit, cannot lie down flat, speaks in phrases, mild retractions, audible wheezing, pulse 100-120.    - SEVERE: Very SOB at rest, speaks in single words, struggling to breathe, sitting hunched forward, retractions, pulse > 120      no 6. FEVER: Do you have a fever? If Yes, ask: What is your temperature, how was it measured, and when did it start?     no 7. CARDIAC HISTORY: Do you have any history of heart disease? (e.g., heart attack, congestive heart  failure)      no 8. LUNG HISTORY: Do you have any history of lung disease?  (e.g., pulmonary embolus, asthma, emphysema)     no 9. PE RISK FACTORS: Do you have a history of blood clots? (or: recent major surgery, recent prolonged travel, bedridden)     no 10. OTHER SYMPTOMS: Do you have any other symptoms? (e.g., runny nose, wheezing, chest pain)       no 12. TRAVEL: Have you traveled out of the country in the last month? (e.g., travel history, exposures)       No  Patient calling with concerns for cough for the last couple of days. Patient endorses that he didn't sleep well last night due to cough. Patient is requesting medication be sent to his pharmacy to help with the cough. Patient is asking for a phone call once medication is sent to the pharmacy.  Protocols used: Cough - Acute Non-Productive-A-AH

## 2023-12-31 NOTE — Telephone Encounter (Signed)
 Copied from CRM #[id number]. Topic: Clinical - Medical Advice >> Dec 31, 2023  2:15 PM Silvana PARAS wrote: Reason for CRM: Patient is calling bc of a cough that he has that will not allow him to sleep. Pt's callback number is [phone]. Nurse advised urgent care or wait until Monday to be seen by alternative providers. [facility] has shorter time.

## 2023-12-31 NOTE — Telephone Encounter (Signed)
 Called pt - lmom to return our all.    Patient is calling bc of a cough that he has that will not allow him to sleep.

## 2023-12-31 NOTE — Telephone Encounter (Signed)
 Provider has been sent message requesting cough medication.

## 2024-01-04 DIAGNOSIS — E119 Type 2 diabetes mellitus without complications: Secondary | ICD-10-CM | POA: Diagnosis not present

## 2024-01-06 DIAGNOSIS — K080 Exfoliation of teeth due to systemic causes: Secondary | ICD-10-CM | POA: Diagnosis not present

## 2024-01-09 ENCOUNTER — Encounter: Payer: Self-pay | Admitting: Family Medicine

## 2024-01-10 ENCOUNTER — Other Ambulatory Visit: Payer: Self-pay

## 2024-01-10 MED ORDER — TIRZEPATIDE 12.5 MG/0.5ML ~~LOC~~ SOAJ: 12.5000 mg | SUBCUTANEOUS | 1 refills | Status: DC

## 2024-01-12 ENCOUNTER — Other Ambulatory Visit: Payer: Self-pay

## 2024-01-12 ENCOUNTER — Telehealth: Payer: Self-pay

## 2024-01-12 MED ORDER — TIRZEPATIDE 12.5 MG/0.5ML ~~LOC~~ SOAJ: 12.5000 mg | SUBCUTANEOUS | 1 refills | Status: DC

## 2024-01-12 NOTE — Telephone Encounter (Signed)
 Copied from CRM [id number]. Topic: Clinical - Prescription Issue >> Jan 12, 2024  1:17 PM Antwanette L wrote: Reason for CRM: Patient needs Dr. [name] to send a new order of tirzepatide  (MOUNJARO ) 12.5 MG/0.5ML Pen  to [facility]. The patient can be contacted at [phone]  Pharmacy Details [facility] [address] [place] [address] Phone: [phone] Fax: [phone] Hours: Not open 24 hours

## 2024-01-24 ENCOUNTER — Ambulatory Visit: Payer: Self-pay

## 2024-01-24 ENCOUNTER — Other Ambulatory Visit: Payer: Self-pay

## 2024-01-24 ENCOUNTER — Telehealth: Payer: Self-pay

## 2024-01-24 DIAGNOSIS — E118 Type 2 diabetes mellitus with unspecified complications: Secondary | ICD-10-CM

## 2024-01-24 MED ORDER — DAPAGLIFLOZIN PROPANEDIOL 10 MG PO TABS: ORAL_TABLET | ORAL | 3 refills | Status: DC

## 2024-01-24 NOTE — Telephone Encounter (Signed)
 Sent in medication

## 2024-01-24 NOTE — Telephone Encounter (Signed)
 Copied from CRM [id number]. Topic: General - Other >> Jan 24, 2024  3:00 PM DeAngela L wrote: Reason for CRM: (attention Ronal Bradley) [facility] calling with [facility] calling to ask if the patient could get a refill written for a 90 day supply of  dapagliflozin  propanediol (FARXIGA ) 10 MG TABS tablet Sent to his pharmacy this is to help cut down on the trips the patient has to make to the pharmacy to get his medication  Call back number [phone] case number [id number]

## 2024-01-24 NOTE — Telephone Encounter (Signed)
 FYI Only or Action Required?: Action required by provider: request for appointment. Protocol advises to be seen within 24 hours - no appts avaiable  Patient was last seen in primary care on 12/16/2023 by [name] Butler DASEN, MD.  Called Nurse Triage reporting Dizziness.  Symptoms began several weeks ago.  Interventions attempted: Nothing.  Symptoms are: stable. - dizziness, comes and goes for a short span of time, no other S/s.  Triage Disposition: See Physician Within 24 Hours  Patient/caregiver understands and will follow disposition?: Yes                    Copied from CRM [id number]. Topic: Clinical - Red Word Triage >> Jan 24, 2024 12:48 PM Tobias L wrote: Red Word that prompted transfer to Nurse Triage: Patient experiencing dizziness Reason for Disposition  [1] NO dizziness now AND [2] one or more stroke risk factors (i.e., hypertension, diabetes, prior stroke/TIA/heart attack)  Answer Assessment - Initial Assessment Questions 1. DESCRIPTION: Describe your dizziness.     Room spinning 2. VERTIGO: Do you feel like either you or the room is spinning or tilting?      sometimes 3. LIGHTHEADED: Do you feel lightheaded? (e.g., somewhat faint, woozy, weak upon standing)     Lightheaded. 4. SEVERITY: How bad is it?  Can you walk?     Stumbles a bit - for a short period of time 5. ONSET:  When did the dizziness begin?     3 weeks 6. AGGRAVATING FACTORS: Does anything make it worse? (e.g., standing, change in head position)     Bending over makes it much worse 7. CAUSE: What do you think is causing the dizziness?     Over heated? 8. RECURRENT SYMPTOM: Have you had dizziness before? If Yes, ask: When was the last time? What happened that time?     no 9. OTHER SYMPTOMS: Do you have any other symptoms? (e.g., earache, headache, numbness, tinnitus, vomiting, weakness)     Weakness -  Protocols used: Dizziness - Vertigo-A-AH

## 2024-01-27 ENCOUNTER — Encounter: Payer: Self-pay | Admitting: Family Medicine

## 2024-01-27 ENCOUNTER — Ambulatory Visit (INDEPENDENT_AMBULATORY_CARE_PROVIDER_SITE_OTHER): Admitting: Family Medicine

## 2024-01-27 VITALS — BP 122/68 | HR 76 | Temp 97.7°F | Ht 70.0 in | Wt 243.0 lb

## 2024-01-27 DIAGNOSIS — R42 Dizziness and giddiness: Secondary | ICD-10-CM

## 2024-01-27 DIAGNOSIS — H8113 Benign paroxysmal vertigo, bilateral: Secondary | ICD-10-CM | POA: Diagnosis not present

## 2024-01-27 LAB — COMPREHENSIVE METABOLIC PANEL WITH GFR
AG Ratio: 2 (calc) (ref 1.0–2.5)
ALT: 21 U/L (ref 9–46)
AST: 19 U/L (ref 10–35)
Albumin: 4.6 g/dL (ref 3.6–5.1)
Alkaline phosphatase (APISO): 74 U/L (ref 35–144)
BUN: 19 mg/dL (ref 7–25)
CO2: 24 mmol/L (ref 20–32)
Calcium: 9.8 mg/dL (ref 8.6–10.3)
Chloride: 104 mmol/L (ref 98–110)
Creat: 0.93 mg/dL (ref 0.70–1.35)
Globulin: 2.3 g/dL (ref 1.9–3.7)
Glucose, Bld: 115 mg/dL — ABNORMAL HIGH (ref 65–99)
Potassium: 3.9 mmol/L (ref 3.5–5.3)
Sodium: 138 mmol/L (ref 135–146)
Total Bilirubin: 0.4 mg/dL (ref 0.2–1.2)
Total Protein: 6.9 g/dL (ref 6.1–8.1)
eGFR: 91 mL/min/1.73m2 (ref >=60)

## 2024-01-27 LAB — CBC WITH DIFFERENTIAL/PLATELET
Absolute Lymphocytes: 1163 {cells}/uL (ref 850–3900)
Absolute Monocytes: 462 {cells}/uL (ref 200–950)
Basophils Absolute: 51 {cells}/uL (ref 0–200)
Basophils Relative: 0.9 %
Eosinophils Absolute: 291 {cells}/uL (ref 15–500)
Eosinophils Relative: 5.1 %
HCT: 46.2 % (ref 38.5–50.0)
Hemoglobin: 15.9 g/dL (ref 13.2–17.1)
MCH: 31.7 pg (ref 27.0–33.0)
MCHC: 34.4 g/dL (ref 32.0–36.0)
MCV: 92 fL (ref 80.0–100.0)
MPV: 9.8 fL (ref 7.5–12.5)
Monocytes Relative: 8.1 %
Neutro Abs: 3734 {cells}/uL (ref 1500–7800)
Neutrophils Relative %: 65.5 %
Platelets: 196 Thousand/uL (ref 140–400)
RBC: 5.02 Million/uL (ref 4.20–5.80)
RDW: 13.5 % (ref 11.0–15.0)
Total Lymphocyte: 20.4 %
WBC: 5.7 Thousand/uL (ref 3.8–10.8)

## 2024-01-27 MED ORDER — MECLIZINE HCL 12.5 MG PO TABS: 12.5000 mg | ORAL_TABLET | Freq: Three times a day (TID) | ORAL | 0 refills | Status: AC | PRN

## 2024-01-27 NOTE — Progress Notes (Signed)
 Subjective:  HPI: James Ponce is a 68 y.o. male presenting on 01/27/2024 for Dizziness (Pt states that when he bends over he gets dizzy. )   Dizziness   Patient is in today for 3-4 weeks of dizziness. This started when he got overheated at work one day. He describes this as the room spinning when he bends over and comes back up. He does not notice this when going from lying to sitting or standing. No syncope or presyncope. No gait instability or loss of balance, sensory changes, slurred speech, or confusion. This  has not happened in the past.  Denies vision changes, nausea, headaches, palpitations, SOB, chest pain. He has recently switched from Ozempic  to mounjaro . Is trying to stay hydrated but not sure he is, drinks 0.5-1 gallon water per day and 3-4 sodas daily. He works as a Visual merchandiser. BP has been running 120-130/60-70. Lying BP 132/66, sitting 134/64, standing 132/70  Review of Systems  Neurological:  Positive for dizziness.  All other systems reviewed and are negative.   Relevant past medical history reviewed and updated as indicated.   Past Medical History:  Diagnosis Date   Arthritis    Diabetes mellitus type II, uncontrolled    Diverticulosis    Hyperlipidemia    Hypertension      History reviewed. No pertinent surgical history.  Allergies and medications reviewed and updated.   Current Outpatient Medications:    celecoxib  (CELEBREX ) 200 MG capsule, Take 1 capsule (200 mg total) by mouth 2 (two) times daily., Disp: 60 capsule, Rfl: 5   dapagliflozin  propanediol (FARXIGA ) 10 MG TABS tablet, TAKE 1 TABLET BY MOUTH EVERY DAY BEFORE BREAKFAST, Disp: 90 tablet, Rfl: 3   doxazosin  (CARDURA ) 4 MG tablet, Take 1 tablet (4 mg total) by mouth daily., Disp: 90 tablet, Rfl: 3   fish oil-omega-3 fatty acids  1000 MG capsule, Take 1-2 capsules (1-2 g total) by mouth 2 (two) times daily. 2 capsules in morning, and 1 in evening, daily, Disp: 90 capsule, Rfl: 3   fluticasone   (FLONASE ) 50 MCG/ACT nasal spray, Place 2 sprays into both nostrils daily., Disp: 48 mL, Rfl: 2   Glucosamine-Chondroitin (GLUCOSAMINE CHONDR COMPLEX PO), Take 1 tablet by mouth 2 (two) times daily., Disp: , Rfl:    glucose blood test strip, Use to check blood sugar twice daily.*Dispense Contour next test strips*, Disp: 100 each, Rfl: 12   HYDROcodone  bit-homatropine (HYCODAN) 5-1.5 MG/5ML syrup, Take 5 mLs by mouth every 8 (eight) hours as needed for cough., Disp: 120 mL, Rfl: 0   loratadine  (CLARITIN ) 10 MG tablet, Take 1 tablet (10 mg total) by mouth daily., Disp: 30 tablet, Rfl: 11   meclizine  (ANTIVERT ) 12.5 MG tablet, Take 1 tablet (12.5 mg total) by mouth every 8 (eight) hours as needed for dizziness., Disp: 30 tablet, Rfl: 0   metFORMIN  (GLUCOPHAGE -XR) 500 MG 24 hr tablet, Take 4 tablets (2,000 mg total) by mouth daily with breakfast., Disp: 360 tablet, Rfl: 2   Multiple Vitamin (MULTIVITAMIN) tablet, Take 1 tablet by mouth daily., Disp: , Rfl:    OZEMPIC , 1 MG/DOSE, 4 MG/3ML SOPN, INJECT 1MG  INTO SKIN ONCE A WEEK, Disp: 9 mL, Rfl: 0   promethazine -dextromethorphan (PROMETHAZINE -DM) 6.25-15 MG/5ML syrup, Take 5 mLs by mouth 4 (four) times daily as needed for cough., Disp: 118 mL, Rfl: 0   rosuvastatin  (CRESTOR ) 10 MG tablet, Take 1 tablet (10 mg total) by mouth daily., Disp: 90 tablet, Rfl: 3   sildenafil  (VIAGRA ) 100 MG tablet, Take 0.5-1  tablets (50-100 mg total) by mouth daily as needed for erectile dysfunction., Disp: 4 tablet, Rfl: 14   tirzepatide  (MOUNJARO ) 12.5 MG/0.5ML Pen, Inject 12.5 mg into the skin once a week., Disp: 2 mL, Rfl: 1   valsartan -hydrochlorothiazide  (DIOVAN -HCT) 320-25 MG tablet, Take 1 tablet by mouth daily., Disp: 90 tablet, Rfl: 3   aspirin EC 81 MG tablet, Take 81 mg by mouth daily., Disp: , Rfl:    azelastine  (ASTELIN ) 0.1 % nasal spray, Use 2 (two) sprays in each nostril once a day., Disp: 30 mL, Rfl: 3  Allergies  Allergen Reactions   Lipitor [Atorvastatin]  Other (See Comments)    Muscle cramps, cant walk, sluggish   Sulfa Antibiotics Itching and Rash    Objective:   BP 122/68   Pulse 76   Temp 97.7 F (36.5 C)   Ht 5' 10 (1.778 m)   Wt 243 lb (110.2 kg)   SpO2 98%   BMI 34.87 kg/m      01/27/2024    7:54 AM 12/16/2023    8:07 AM 12/16/2023    8:06 AM  Vitals with BMI  Height 5' 10  5' 10  Weight 243 lbs 249 lbs   BMI 34.87 35.73   Systolic 122 148   Diastolic 68 68   Pulse 76 76        Physical Exam Vitals and nursing note reviewed.  Constitutional:      Appearance: Normal appearance. He is normal weight.  HENT:     Head: Normocephalic and atraumatic.     Right Ear: Tympanic membrane, ear canal and external ear normal.     Left Ear: Tympanic membrane, ear canal and external ear normal.  Eyes:     General: Gaze aligned appropriately.     Extraocular Movements: Extraocular movements intact.     Conjunctiva/sclera: Conjunctivae normal.     Pupils: Pupils are equal, round, and reactive to light.  Cardiovascular:     Rate and Rhythm: Normal rate and regular rhythm.     Pulses: Normal pulses.     Heart sounds: Normal heart sounds.  Pulmonary:     Effort: Pulmonary effort is normal.     Breath sounds: Normal breath sounds.  Skin:    General: Skin is warm and dry.     Capillary Refill: Capillary refill takes less than 2 seconds.  Neurological:     General: No focal deficit present.     Mental Status: He is alert and oriented to person, place, and time. Mental status is at baseline.     GCS: GCS eye subscore is 4. GCS verbal subscore is 5. GCS motor subscore is 6.     Cranial Nerves: Cranial nerves 2-12 are intact.     Sensory: Sensation is intact.     Motor: Motor function is intact.     Coordination: Coordination is intact.     Gait: Gait is intact.  Psychiatric:        Mood and Affect: Mood normal.        Behavior: Behavior normal.        Thought Content: Thought content normal.        Judgment: Judgment  normal.     Assessment & Plan:  BPPV (benign paroxysmal positional vertigo), bilateral Assessment & Plan: Symptoms consistent with vertigo. Negative HINTS and orthostatics. CBC and CMP pending. Horizontal upward beating nystagmus present bilaterally with Trenda Craze, R>L. Performed Epley maneuver with patient and provided handout how to perform this at  home. Start Meclizine  12.5mg  q8h PRN for dizziness. Vestibular rehab ordered if Epley ineffective. Stay hydrated. Return to office if symptoms persist or worsen.    Dizziness -     CBC with Differential/Platelet -     Comprehensive metabolic panel with GFR -     Ambulatory referral to Physical Therapy  Other orders -     Meclizine  HCl; Take 1 tablet (12.5 mg total) by mouth every 8 (eight) hours as needed for dizziness.  Dispense: 30 tablet; Refill: 0     Follow up plan: Return if symptoms worsen or fail to improve.  Jeoffrey GORMAN Barrio, FNP

## 2024-01-27 NOTE — Assessment & Plan Note (Signed)
 Symptoms consistent with vertigo. Negative HINTS and orthostatics. CBC and CMP pending. Horizontal upward beating nystagmus present bilaterally with Trenda Craze, R>L. Performed Epley maneuver with patient and provided handout how to perform this at home. Start Meclizine  12.5mg  q8h PRN for dizziness. Vestibular rehab ordered if Epley ineffective. Stay hydrated. Return to office if symptoms persist or worsen.

## 2024-01-29 ENCOUNTER — Other Ambulatory Visit: Payer: Self-pay | Admitting: Family Medicine

## 2024-01-29 DIAGNOSIS — J302 Other seasonal allergic rhinitis: Secondary | ICD-10-CM

## 2024-01-31 ENCOUNTER — Ambulatory Visit: Payer: Self-pay | Admitting: Family Medicine

## 2024-02-04 DIAGNOSIS — E119 Type 2 diabetes mellitus without complications: Secondary | ICD-10-CM | POA: Diagnosis not present

## 2024-02-07 ENCOUNTER — Other Ambulatory Visit: Payer: Self-pay

## 2024-02-07 ENCOUNTER — Telehealth: Payer: Self-pay

## 2024-02-07 DIAGNOSIS — E118 Type 2 diabetes mellitus with unspecified complications: Secondary | ICD-10-CM

## 2024-02-07 MED ORDER — DAPAGLIFLOZIN PROPANEDIOL 10 MG PO TABS: ORAL_TABLET | ORAL | 3 refills | Status: DC

## 2024-02-07 NOTE — Telephone Encounter (Signed)
 Copied from CRM [id number]. Topic: Clinical - Prescription Issue >> Feb 07, 2024  9:42 AM Berneda FALCON wrote: Reason for CRM: [facility] from [facility] states the pharmacy never got the prescription for the dapagliflozin  propanediol (FARXIGA ) 10 MG TABS tablet. Please resend. I do see it was sent over on 7/21 90-day supply which I did let her know.  [place]'s callback is [phone]  Case [id number]

## 2024-02-22 DIAGNOSIS — K080 Exfoliation of teeth due to systemic causes: Secondary | ICD-10-CM | POA: Diagnosis not present

## 2024-02-24 ENCOUNTER — Other Ambulatory Visit: Payer: Self-pay

## 2024-02-24 ENCOUNTER — Telehealth: Payer: Self-pay | Admitting: Family Medicine

## 2024-02-24 DIAGNOSIS — E7800 Pure hypercholesterolemia, unspecified: Secondary | ICD-10-CM

## 2024-02-24 DIAGNOSIS — E118 Type 2 diabetes mellitus with unspecified complications: Secondary | ICD-10-CM

## 2024-02-24 MED ORDER — TIRZEPATIDE 12.5 MG/0.5ML ~~LOC~~ SOAJ: 12.5000 mg | SUBCUTANEOUS | 3 refills | Status: DC

## 2024-02-24 NOTE — Telephone Encounter (Signed)
 Prescription Request  02/24/2024  LOV: 12/16/2023  What is the name of the medication or equipment?   tirzepatide  (MOUNJARO ) 12.5 MG/0.5ML Pen   Have you contacted your pharmacy to request a refill? Yes   Which pharmacy would you like this sent to?  [facility] - [place], [place] - [address] [place] [address] Phone: [phone] Fax: [phone]    Patient notified that their request is being sent to the clinical staff for review and that they should receive a response within 2 business days.   Please advise pharmacist.

## 2024-02-25 ENCOUNTER — Other Ambulatory Visit: Payer: Self-pay | Admitting: Family Medicine

## 2024-02-25 DIAGNOSIS — E118 Type 2 diabetes mellitus with unspecified complications: Secondary | ICD-10-CM

## 2024-02-25 NOTE — Telephone Encounter (Signed)
 Requested Prescriptions  Refused Prescriptions Disp Refills   metFORMIN  (GLUCOPHAGE -XR) 500 MG 24 hr tablet [Pharmacy Med Name: metformin  ER 500 mg tablet,extended release 24 hr] 360 tablet 1    Sig: TAKE 4 TABLETS BY MOUTH DAILY WITH BREAKFAST     Endocrinology:  Diabetes - Biguanides Failed - 02/25/2024  5:25 PM      Failed - HBA1C is between 0 and 7.9 and within 180 days    Hgb A1C (fingerstick)  Date Value Ref Range Status  12/28/2014 6.7 (H) <5.7 % Final    Comment:                                                                           According to the ADA Clinical Practice Recommendations for 2011, when HbA1c is used as a screening test:     >=6.5%   Diagnostic of Diabetes Mellitus            (if abnormal result is confirmed)   5.7-6.4%   Increased risk of developing Diabetes Mellitus   References:Diagnosis and Classification of Diabetes Mellitus,Diabetes Care,2011,34(Suppl 1):S62-S69 and Standards of Medical Care in         Diabetes - 2011,Diabetes Care,2011,34 (Suppl 1):S11-S61.      Hgb A1c MFr Bld  Date Value Ref Range Status  06/11/2023 6.1 (H) <5.7 % of total Hgb Final    Comment:    For someone without known diabetes, a hemoglobin  A1c value between 5.7% and 6.4% is consistent with prediabetes and should be confirmed with a  follow-up test. . For someone with known diabetes, a value <7% indicates that their diabetes is well controlled. A1c targets should be individualized based on duration of diabetes, age, comorbid conditions, and other considerations. . This assay result is consistent with an increased risk of diabetes. . Currently, no consensus exists regarding use of hemoglobin A1c for diagnosis of diabetes for children. .          Passed - Cr in normal range and within 360 days    Creat  Date Value Ref Range Status  01/27/2024 0.93 0.70 - 1.35 mg/dL Final   Creatinine, Urine  Date Value Ref Range Status  06/11/2023 102 20 - 320 mg/dL Final          Passed - eGFR in normal range and within 360 days    GFR, Est African American  Date Value Ref Range Status  03/25/2020 110 > OR = 60 mL/min/1.73m2 Final   GFR, Est Non African American  Date Value Ref Range Status  03/25/2020 95 > OR = 60 mL/min/1.50m2 Final   eGFR  Date Value Ref Range Status  01/27/2024 91 > OR = 60 mL/min/1.21m2 Final         Passed - B12 Level in normal range and within 720 days    Vitamin B-12  Date Value Ref Range Status  06/11/2023 516 200 - 1,100 pg/mL Final         Passed - Valid encounter within last 6 months    Recent Outpatient Visits           4 weeks ago BPPV (benign paroxysmal positional vertigo), bilateral   [facility] [place] Kayla,  Jeoffrey RAMAN, FNP   2 months ago Controlled type 2 diabetes mellitus with complication, without long-term current use of insulin (HCC)   [facility] Pickard, Butler DASEN, MD   8 months ago General medical exam   [facility] [name] Butler DASEN, MD   1 year ago Acute non-recurrent frontal sinusitis   [facility] Kayla Jeoffrey RAMAN, FNP   1 year ago Acute non-recurrent frontal sinusitis   [facility] Pickard, Butler DASEN, MD              Passed - CBC within normal limits and completed in the last 12 months    WBC  Date Value Ref Range Status  01/27/2024 5.7 3.8 - 10.8 Thousand/uL Final   RBC  Date Value Ref Range Status  01/27/2024 5.02 4.20 - 5.80 Million/uL Final   Hemoglobin  Date Value Ref Range Status  01/27/2024 15.9 13.2 - 17.1 g/dL Final   HCT  Date Value Ref Range Status  01/27/2024 46.2 38.5 - 50.0 % Final   MCHC  Date Value Ref Range Status  01/27/2024 34.4 32.0 - 36.0 g/dL Final    Comment:    For adults, a slight decrease in the calculated MCHC value (in the range of 30 to 32 g/dL) is most likely not clinically significant; however, it should  be interpreted with caution in correlation with other red cell parameters and the patient's clinical condition.    [facility]  Date Value Ref Range Status  01/27/2024 31.7 27.0 - 33.0 pg Final   MCV  Date Value Ref Range Status  01/27/2024 92.0 80.0 - 100.0 fL Final   No results found for: PLTCOUNTKUC, LABPLAT, POCPLA RDW  Date Value Ref Range Status  01/27/2024 13.5 11.0 - 15.0 % Final

## 2024-03-02 ENCOUNTER — Ambulatory Visit: Payer: Medicare Other | Admitting: *Deleted

## 2024-03-02 VITALS — Ht 68.0 in | Wt 243.0 lb

## 2024-03-02 DIAGNOSIS — Z0000 Encounter for general adult medical examination without abnormal findings: Secondary | ICD-10-CM | POA: Diagnosis not present

## 2024-03-02 NOTE — Patient Instructions (Signed)
 James Ponce , Thank you for taking time to come for your Medicare Wellness Visit. I appreciate your ongoing commitment to your health goals. Please review the following plan we discussed and let me know if I can assist you in the future.   Screening recommendations/referrals: Colonoscopy: up to date Recommended yearly ophthalmology/optometry visit for glaucoma screening and checkup Recommended yearly dental visit for hygiene and checkup  Vaccinations: Influenza vaccine: Education provided Pneumococcal vaccine: up to date Tdap vaccine: up to date Shingles vaccine: up to date       Preventive Care 65 Years and Older, Male Preventive care refers to lifestyle choices and visits with your health care provider that can promote health and wellness. What does preventive care include? A yearly physical exam. This is also called an annual well check. Dental exams once or twice a year. Routine eye exams. Ask your health care provider how often you should have your eyes checked. Personal lifestyle choices, including: Daily care of your teeth and gums. Regular physical activity. Eating a healthy diet. Avoiding tobacco and drug use. Limiting alcohol use. Practicing safe sex. Taking low doses of aspirin every day. Taking vitamin and mineral supplements as recommended by your health care provider. What happens during an annual well check? The services and screenings done by your health care provider during your annual well check will depend on your age, overall health, lifestyle risk factors, and family history of disease. Counseling  Your health care provider may ask you questions about your: Alcohol use. Tobacco use. Drug use. Emotional well-being. Home and relationship well-being. Sexual activity. Eating habits. History of falls. Memory and ability to understand (cognition). Work and work Astronomer. Screening  You may have the following tests or measurements: Height, weight, and  BMI. Blood pressure. Lipid and cholesterol levels. These may be checked every 5 years, or more frequently if you are over 37 years old. Skin check. Lung cancer screening. You may have this screening every year starting at age 77 if you have a 30-pack-year history of smoking and currently smoke or have quit within the past 15 years. Fecal occult blood test (FOBT) of the stool. You may have this test every year starting at age 69. Flexible sigmoidoscopy or colonoscopy. You may have a sigmoidoscopy every 5 years or a colonoscopy every 10 years starting at age 70. Prostate cancer screening. Recommendations will vary depending on your family history and other risks. Hepatitis C blood test. Hepatitis B blood test. Sexually transmitted disease (STD) testing. Diabetes screening. This is done by checking your blood sugar (glucose) after you have not eaten for a while (fasting). You may have this done every 1-3 years. Abdominal aortic aneurysm (AAA) screening. You may need this if you are a current or former smoker. Osteoporosis. You may be screened starting at age 25 if you are at high risk. Talk with your health care provider about your test results, treatment options, and if necessary, the need for more tests. Vaccines  Your health care provider may recommend certain vaccines, such as: Influenza vaccine. This is recommended every year. Tetanus, diphtheria, and acellular pertussis (Tdap, Td) vaccine. You may need a Td booster every 10 years. Zoster vaccine. You may need this after age 30. Pneumococcal 13-valent conjugate (PCV13) vaccine. One dose is recommended after age 31. Pneumococcal polysaccharide (PPSV23) vaccine. One dose is recommended after age 49. Talk to your health care provider about which screenings and vaccines you need and how often you need them. This information is not intended  to replace advice given to you by your health care provider. Make sure you discuss any questions you have  with your health care provider. Document Released: 07/19/2015 Document Revised: 03/11/2016 Document Reviewed: 04/23/2015 Elsevier Interactive Patient Education  2017 [employer].  Fall Prevention in the Home Falls can cause injuries. They can happen to people of all ages. There are many things you can do to make your home safe and to help prevent falls. What can I do on the outside of my home? Regularly fix the edges of walkways and driveways and fix any cracks. Remove anything that might make you trip as you walk through a door, such as a raised step or threshold. Trim any bushes or trees on the path to your home. Use bright outdoor lighting. Clear any walking paths of anything that might make someone trip, such as rocks or tools. Regularly check to see if handrails are loose or broken. Make sure that both sides of any steps have handrails. Any raised decks and porches should have guardrails on the edges. Have any leaves, snow, or ice cleared regularly. Use sand or salt on walking paths during winter. Clean up any spills in your garage right away. This includes oil or grease spills. What can I do in the bathroom? Use night lights. Install grab bars by the toilet and in the tub and shower. Do not use towel bars as grab bars. Use non-skid mats or decals in the tub or shower. If you need to sit down in the shower, use a plastic, non-slip stool. Keep the floor dry. Clean up any water that spills on the floor as soon as it happens. Remove soap buildup in the tub or shower regularly. Attach bath mats securely with double-sided non-slip rug tape. Do not have throw rugs and other things on the floor that can make you trip. What can I do in the bedroom? Use night lights. Make sure that you have a light by your bed that is easy to reach. Do not use any sheets or blankets that are too big for your bed. They should not hang down onto the floor. Have a firm chair that has side arms. You can use  this for support while you get dressed. Do not have throw rugs and other things on the floor that can make you trip. What can I do in the kitchen? Clean up any spills right away. Avoid walking on wet floors. Keep items that you use a lot in easy-to-reach places. If you need to reach something above you, use a strong step stool that has a grab bar. Keep electrical cords out of the way. Do not use floor polish or wax that makes floors slippery. If you must use wax, use non-skid floor wax. Do not have throw rugs and other things on the floor that can make you trip. What can I do with my stairs? Do not leave any items on the stairs. Make sure that there are handrails on both sides of the stairs and use them. Fix handrails that are broken or loose. Make sure that handrails are as long as the stairways. Check any carpeting to make sure that it is firmly attached to the stairs. Fix any carpet that is loose or worn. Avoid having throw rugs at the top or bottom of the stairs. If you do have throw rugs, attach them to the floor with carpet tape. Make sure that you have a light switch at the top of the stairs and  the bottom of the stairs. If you do not have them, ask someone to add them for you. What else can I do to help prevent falls? Wear shoes that: Do not have high heels. Have rubber bottoms. Are comfortable and fit you well. Are closed at the toe. Do not wear sandals. If you use a stepladder: Make sure that it is fully opened. Do not climb a closed stepladder. Make sure that both sides of the stepladder are locked into place. Ask someone to hold it for you, if possible. Clearly mark and make sure that you can see: Any grab bars or handrails. First and last steps. Where the edge of each step is. Use tools that help you move around (mobility aids) if they are needed. These include: Canes. Walkers. Scooters. Crutches. Turn on the lights when you go into a dark area. Replace any light bulbs  as soon as they burn out. Set up your furniture so you have a clear path. Avoid moving your furniture around. If any of your floors are uneven, fix them. If there are any pets around you, be aware of where they are. Review your medicines with your doctor. Some medicines can make you feel dizzy. This can increase your chance of falling. Ask your doctor what other things that you can do to help prevent falls. This information is not intended to replace advice given to you by your health care provider. Make sure you discuss any questions you have with your health care provider. Document Released: 04/18/2009 Document Revised: 11/28/2015 Document Reviewed: 07/27/2014 Elsevier Interactive Patient Education  2017 [employer].

## 2024-03-02 NOTE — Progress Notes (Signed)
 Subjective:   James Ponce is a 68 y.o. male who presents for Medicare Annual/Subsequent preventive examination.  Visit Complete: Virtual I connected with  James Ponce on 03/02/24 by a audio enabled telemedicine application and verified that I am speaking with the correct person using two identifiers.  Patient Location: Home  Provider Location: Home Office  I discussed the limitations of evaluation and management by telemedicine. The patient expressed understanding and agreed to proceed.  Vital Signs: Because this visit was a virtual/telehealth visit, some criteria may be missing or patient reported. Any vitals not documented were not able to be obtained and vitals that have been documented are patient reported.  Cardiac Risk Factors include: advanced age (>18men, >86 women);hypertension;male gender;diabetes mellitus;obesity (BMI >30kg/m2)     Objective:    Today's Vitals   03/02/24 0945  Weight: 243 lb (110.2 kg)  Height: 5' 8 (1.727 m)   Body mass index is 36.95 kg/m.     03/02/2024    9:44 AM 02/25/2023    9:37 AM 02/25/2023    9:30 AM  Advanced Directives  Does Patient Have a Medical Advance Directive? Yes No No  Type of Electronics engineer of Healthcare Power of Attorney in Chart? No - copy requested    Would patient like information on creating a medical advance directive?  Yes (MAU/Ambulatory/Procedural Areas - Information given) Yes (MAU/Ambulatory/Procedural Areas - Information given)    Current Medications (verified) Outpatient Encounter Medications as of 03/02/2024  Medication Sig   aspirin EC 81 MG tablet Take 81 mg by mouth daily.   azelastine  (ASTELIN ) 0.1 % nasal spray USE TWO SPRAYS IN EACH NOSTRIL ONCE A DAY   celecoxib  (CELEBREX ) 200 MG capsule Take 1 capsule (200 mg total) by mouth 2 (two) times daily.   dapagliflozin  propanediol (FARXIGA ) 10 MG TABS tablet TAKE 1 TABLET BY MOUTH EVERY DAY BEFORE BREAKFAST    doxazosin  (CARDURA ) 4 MG tablet Take 1 tablet (4 mg total) by mouth daily.   fish oil-omega-3 fatty acids  1000 MG capsule Take 1-2 capsules (1-2 g total) by mouth 2 (two) times daily. 2 capsules in morning, and 1 in evening, daily   fluticasone  (FLONASE ) 50 MCG/ACT nasal spray Place 2 sprays into both nostrils daily.   Glucosamine-Chondroitin (GLUCOSAMINE CHONDR COMPLEX PO) Take 1 tablet by mouth 2 (two) times daily.   glucose blood test strip Use to check blood sugar twice daily.*Dispense Contour next test strips*   HYDROcodone  bit-homatropine (HYCODAN) 5-1.5 MG/5ML syrup Take 5 mLs by mouth every 8 (eight) hours as needed for cough.   loratadine  (CLARITIN ) 10 MG tablet Take 1 tablet (10 mg total) by mouth daily.   meclizine  (ANTIVERT ) 12.5 MG tablet Take 1 tablet (12.5 mg total) by mouth every 8 (eight) hours as needed for dizziness.   metFORMIN  (GLUCOPHAGE -XR) 500 MG 24 hr tablet Take 4 tablets (2,000 mg total) by mouth daily with breakfast.   Multiple Vitamin (MULTIVITAMIN) tablet Take 1 tablet by mouth daily.   promethazine -dextromethorphan (PROMETHAZINE -DM) 6.25-15 MG/5ML syrup Take 5 mLs by mouth 4 (four) times daily as needed for cough.   rosuvastatin  (CRESTOR ) 10 MG tablet Take 1 tablet (10 mg total) by mouth daily.   sildenafil  (VIAGRA ) 100 MG tablet Take 0.5-1 tablets (50-100 mg total) by mouth daily as needed for erectile dysfunction.   tirzepatide  (MOUNJARO ) 12.5 MG/0.5ML Pen Inject 12.5 mg into the skin once a week.   valsartan -hydrochlorothiazide  (DIOVAN -HCT) 320-25 MG tablet Take 1 tablet  by mouth daily.   No facility-administered encounter medications on file as of 03/02/2024.    Allergies (verified) Lipitor [atorvastatin] and Sulfa antibiotics   History: Past Medical History:  Diagnosis Date   Arthritis    Diabetes mellitus type II, uncontrolled    Diverticulosis    Hyperlipidemia    Hypertension    History reviewed. No pertinent surgical history. History reviewed. No  pertinent family history. Social History   Socioeconomic History   Marital status: Married    Spouse name: Not on file   Number of children: Not on file   Years of education: Not on file   Highest education level: Not on file  Occupational History   Not on file  Tobacco Use   Smoking status: Never   Smokeless tobacco: Current    Types: Chew  Vaping Use   Vaping status: Never Used  Substance and Sexual Activity   Alcohol use: No   Drug use: No   Sexual activity: Not on file  Other Topics Concern   Not on file  Social History Narrative   Not on file   Social Drivers of Health   Financial Resource Strain: Low Risk  (03/02/2024)   Overall Financial Resource Strain (CARDIA)    Difficulty of Paying Living Expenses: Not very hard  Food Insecurity: No Food Insecurity (03/02/2024)   Hunger Vital Sign    Worried About Running Out of Food in the Last Year: Never true    Ran Out of Food in the Last Year: Never true  Transportation Needs: No Transportation Needs (02/25/2023)   PRAPARE - Administrator, Civil Service (Medical): No    Lack of Transportation (Non-Medical): No  Physical Activity: Sufficiently Active (03/02/2024)   Exercise Vital Sign    Days of Exercise per Week: 5 days    Minutes of Exercise per Session: 50 min  Stress: No Stress Concern Present (03/02/2024)   [employer] of Occupational Health - Occupational Stress Questionnaire    Feeling of Stress: Not at all  Social Connections: Moderately Integrated (03/02/2024)   Social Connection and Isolation Panel    Frequency of Communication with Friends and Family: More than three times a week    Frequency of Social Gatherings with Friends and Family: Three times a week    Attends Religious Services: More than 4 times per year    Active Member of Clubs or Organizations: No    Attends Banker Meetings: Never    Marital Status: Married    Tobacco Counseling Ready to quit: Not  Answered Counseling given: Not Answered   Clinical Intake:  Pre-visit preparation completed: Yes  Pain : No/denies pain     Diabetes: Yes CBG done?: No Did pt. bring in CBG monitor from home?: No  How often do you need to have someone help you when you read instructions, pamphlets, or other written materials from your doctor or pharmacy?: 1 - Never  Interpreter Needed?: No  Information entered by :: Mliss Graff LPN   Activities of Daily Living    03/02/2024    9:46 AM  In your present state of health, do you have any difficulty performing the following activities:  Hearing? 0  Vision? 0  Difficulty concentrating or making decisions? 0  Walking or climbing stairs? 0  Dressing or bathing? 0  Doing errands, shopping? 0  Preparing Food and eating ? N  Using the Toilet? N  In the past six months, have you accidently leaked urine?  N  Do you have problems with loss of bowel control? N  Managing your Medications? N  Managing your Finances? N    Patient Care Team: [name] Butler DASEN, MD as PCP - [facility] (Family Medicine) [facility], [facility] (Optometry)  Indicate any recent Medical Services you may have received from other than [facility] providers in the past year (date may be approximate).     Assessment:   This is a routine wellness examination for Malichi.  Hearing/Vision screen Hearing Screening - Comments:: No trouble hearing Vision Screening - Comments:: [facility] Up to date   Goals Addressed   None    Depression Screen    03/02/2024    9:48 AM 06/17/2023    8:31 AM 02/25/2023    9:36 AM 06/03/2022    7:53 AM 12/16/2021    7:55 AM 08/11/2019    8:08 AM 07/26/2018    8:08 AM  PHQ 2/9 Scores  PHQ - 2 Score 0 0 0 0 0 0 0  PHQ- 9 Score 0 1         Fall Risk    03/02/2024    9:43 AM 06/17/2023    8:31 AM 02/25/2023    9:30 AM 06/03/2022    7:53 AM 12/16/2021    7:55 AM  Fall Risk   Falls in the past year? 1 0 0 0 0  Number falls in past yr: 0 0 0 0    Injury with Fall? 0 0 0 0   Risk for fall due to :   No Fall Risks No Fall Risks   Follow up Falls evaluation completed;Education provided;Falls prevention discussed  Falls prevention discussed;Education provided;Falls evaluation completed Falls prevention discussed  Falls evaluation completed      Data saved with a previous flowsheet row definition    MEDICARE RISK AT HOME: Medicare Risk at Home Any stairs in or around the home?: Yes If so, are there any without handrails?: No Home free of loose throw rugs in walkways, pet beds, electrical cords, etc?: Yes Adequate lighting in your home to reduce risk of falls?: Yes Life alert?: No Use of a cane, walker or w/c?: No Grab bars in the bathroom?: No Shower chair or bench in shower?: Yes Elevated toilet seat or a handicapped toilet?: Yes  TIMED UP AND GO:  Was the test performed?  No    Cognitive Function:        03/02/2024    9:45 AM 02/25/2023    9:38 AM  6CIT Screen  What Year? 0 points 0 points  What month? 0 points 0 points  What time? 0 points 0 points  Count back from 20 0 points 0 points  Months in reverse 0 points 0 points  Repeat phrase 2 points 0 points  Total Score 2 points 0 points    Immunizations Immunization History  Administered Date(s) Administered   Fluad Quad(high Dose 65+) 08/11/2019   Fluad Trivalent(High Dose 65+) 06/11/2023   INFLUENZA, HIGH DOSE SEASONAL PF 06/03/2022   Influenza, Seasonal, Injecte, Preservative Fre 04/23/2013   Influenza,inj,Quad PF,6+ Mos 05/17/2014, 06/17/2016, 06/25/2017, 06/21/2018, 06/26/2020, 06/13/2021   Influenza-Unspecified 07/02/1999, 05/25/2002, 05/01/2004, 06/05/2005, 06/23/2007, 06/09/2011, 06/10/2012   PNEUMOCOCCAL CONJUGATE-20 12/16/2021   Pneumococcal Polysaccharide-23 07/26/2018   Td 08/04/1999, 07/15/2010   Tdap 07/15/2010, 04/01/2021   Zoster Recombinant(Shingrix ) 07/22/2017, 10/20/2017    TDAP status: Up to date  Flu Vaccine status: Due, Education has  been provided regarding the importance of this vaccine. Advised may receive this  vaccine at local pharmacy or Health Dept. Aware to provide a copy of the vaccination record if obtained from local pharmacy or Health Dept. Verbalized acceptance and understanding.  Pneumococcal vaccine status: Up to date  Covid-19 vaccine status: Information provided on how to obtain vaccines.   Qualifies for Shingles Vaccine? No   Zostavax completed Yes   Shingrix  Completed?: Yes  Screening Tests Health Maintenance  Topic Date Due   Hepatitis C Screening  Never done   HEMOGLOBIN A1C  12/10/2023   INFLUENZA VACCINE  02/04/2024   Diabetic kidney evaluation - Urine ACR  06/10/2024   FOOT EXAM  06/16/2024   OPHTHALMOLOGY EXAM  10/31/2024   Diabetic kidney evaluation - eGFR measurement  01/26/2025   Medicare Annual Wellness (AWV)  03/02/2025   Colonoscopy  12/22/2027   DTaP/Tdap/Td (5 - Td or Tdap) 04/02/2031   Pneumococcal Vaccine: 50+ Years  Completed   Zoster Vaccines- Shingrix   Completed   HPV VACCINES  Aged Out   Meningococcal B Vaccine  Aged Out   COVID-19 Vaccine  Discontinued    Health Maintenance  Health Maintenance Due  Topic Date Due   Hepatitis C Screening  Never done   HEMOGLOBIN A1C  12/10/2023   INFLUENZA VACCINE  02/04/2024    Colorectal cancer screening: Type of screening: Colonoscopy. Completed 2022. Repeat every 10 years  Lung Cancer Screening: (Low Dose CT Chest recommended if Age 32-80 years, 20 pack-year currently smoking OR have quit w/in 15years.) does not qualify.   Lung Cancer Screening Referral:   Additional Screening:  Hepatitis C Screening  completed  Vision Screening: Recommended annual ophthalmology exams for early detection of glaucoma and other disorders of the eye. Is the patient up to date with their annual eye exam?  Yes  Who is the provider or what is the name of the office in which the patient attends annual eye exams? [facility] If pt is not  established with a provider, would they like to be referred to a provider to establish care? No .   Dental Screening: Recommended annual dental exams for proper oral hygiene  Nutrition Risk Assessment:  Has the patient had any N/V/D within the last 2 months?  No  Does the patient have any non-healing wounds?  No  Has the patient had any unintentional weight loss or weight gain?  No   Diabetes:  Is the patient diabetic?  Yes  If diabetic, was a CBG obtained today?  No  Did the patient bring in their glucometer from home?  No  How often do you monitor your CBG's? 3 times a day.   Financial Strains and Diabetes Management:  Are you having any financial strains with the device, your supplies or your medication? No .  Does the patient want to be seen by Chronic Care Management for management of their diabetes?  No  Would the patient like to be referred to a Nutritionist or for Diabetic Management?  No   Diabetic Exams:  Diabetic Eye Exam: Completed Pt has been advised about the importance in completing this exam  Diabetic Foot Exam: . Pt has been advised about the importance in completing this exam. .    Community Resource Referral / Chronic Care Management: CRR required this visit?  No   CCM required this visit?  No     Plan:     I have personally reviewed and noted the following in the patient's chart:   Medical and social history Use of alcohol, tobacco or  illicit drugs  Current medications and supplements including opioid prescriptions. Patient is not currently taking opioid prescriptions. Functional ability and status Nutritional status Physical activity Advanced directives List of other physicians Hospitalizations, surgeries, and ER visits in previous 12 months Vitals Screenings to include cognitive, depression, and falls Referrals and appointments  In addition, I have reviewed and discussed with patient certain preventive protocols, quality metrics, and best  practice recommendations. A written personalized care plan for preventive services as well as general preventive health recommendations were provided to patient.     Mliss Graff, LPN   [id number]   After Visit Summary: (MyChart) Due to this being a telephonic visit, the after visit summary with patients personalized plan was offered to patient via MyChart   Nurse Notes:

## 2024-03-06 DIAGNOSIS — E119 Type 2 diabetes mellitus without complications: Secondary | ICD-10-CM | POA: Diagnosis not present

## 2024-03-08 DIAGNOSIS — K080 Exfoliation of teeth due to systemic causes: Secondary | ICD-10-CM | POA: Diagnosis not present

## 2024-03-13 ENCOUNTER — Encounter: Payer: Self-pay | Admitting: Family Medicine

## 2024-03-13 ENCOUNTER — Other Ambulatory Visit: Payer: Self-pay

## 2024-03-13 DIAGNOSIS — E7800 Pure hypercholesterolemia, unspecified: Secondary | ICD-10-CM

## 2024-03-21 DIAGNOSIS — K080 Exfoliation of teeth due to systemic causes: Secondary | ICD-10-CM | POA: Diagnosis not present

## 2024-03-23 ENCOUNTER — Ambulatory Visit
Admission: RE | Admit: 2024-03-23 | Discharge: 2024-03-23 | Disposition: A | Discharge status: Home / Self Care | Payer: Self-pay | Source: Ambulatory Visit | Attending: Family Medicine | Admitting: Family Medicine

## 2024-03-23 DIAGNOSIS — E7800 Pure hypercholesterolemia, unspecified: Secondary | ICD-10-CM | POA: Insufficient documentation

## 2024-03-24 ENCOUNTER — Ambulatory Visit: Payer: Self-pay | Admitting: Family Medicine

## 2024-03-27 ENCOUNTER — Encounter: Payer: Self-pay | Admitting: Family Medicine

## 2024-04-03 ENCOUNTER — Emergency Department (HOSPITAL_COMMUNITY)

## 2024-04-03 ENCOUNTER — Other Ambulatory Visit: Payer: Self-pay | Admitting: Family Medicine

## 2024-04-03 ENCOUNTER — Emergency Department (HOSPITAL_COMMUNITY)
Admission: EM | Admit: 2024-04-03 | Discharge: 2024-04-03 | Disposition: A | Discharge status: Home / Self Care | Attending: Emergency Medicine | Admitting: Emergency Medicine

## 2024-04-03 ENCOUNTER — Other Ambulatory Visit: Payer: Self-pay

## 2024-04-03 ENCOUNTER — Encounter (HOSPITAL_COMMUNITY): Payer: Self-pay

## 2024-04-03 DIAGNOSIS — S299XXA Unspecified injury of thorax, initial encounter: Secondary | ICD-10-CM | POA: Diagnosis present

## 2024-04-03 DIAGNOSIS — M19042 Primary osteoarthritis, left hand: Secondary | ICD-10-CM | POA: Diagnosis not present

## 2024-04-03 DIAGNOSIS — S0281XA Fracture of other specified skull and facial bones, right side, initial encounter for closed fracture: Secondary | ICD-10-CM | POA: Diagnosis not present

## 2024-04-03 DIAGNOSIS — E119 Type 2 diabetes mellitus without complications: Secondary | ICD-10-CM | POA: Insufficient documentation

## 2024-04-03 DIAGNOSIS — S0231XA Fracture of orbital floor, right side, initial encounter for closed fracture: Secondary | ICD-10-CM | POA: Diagnosis not present

## 2024-04-03 DIAGNOSIS — I10 Essential (primary) hypertension: Secondary | ICD-10-CM | POA: Diagnosis not present

## 2024-04-03 DIAGNOSIS — S02109A Fracture of base of skull, unspecified side, initial encounter for closed fracture: Secondary | ICD-10-CM | POA: Diagnosis not present

## 2024-04-03 DIAGNOSIS — M19032 Primary osteoarthritis, left wrist: Secondary | ICD-10-CM | POA: Diagnosis not present

## 2024-04-03 DIAGNOSIS — S0240CA Maxillary fracture, right side, initial encounter for closed fracture: Secondary | ICD-10-CM | POA: Diagnosis not present

## 2024-04-03 DIAGNOSIS — S0285XA Fracture of orbit, unspecified, initial encounter for closed fracture: Secondary | ICD-10-CM

## 2024-04-03 DIAGNOSIS — S01111A Laceration without foreign body of right eyelid and periocular area, initial encounter: Secondary | ICD-10-CM | POA: Diagnosis not present

## 2024-04-03 DIAGNOSIS — S2241XA Multiple fractures of ribs, right side, initial encounter for closed fracture: Secondary | ICD-10-CM | POA: Insufficient documentation

## 2024-04-03 DIAGNOSIS — S0219XA Other fracture of base of skull, initial encounter for closed fracture: Secondary | ICD-10-CM | POA: Insufficient documentation

## 2024-04-03 DIAGNOSIS — Z7984 Long term (current) use of oral hypoglycemic drugs: Secondary | ICD-10-CM | POA: Insufficient documentation

## 2024-04-03 DIAGNOSIS — M25532 Pain in left wrist: Secondary | ICD-10-CM | POA: Diagnosis not present

## 2024-04-03 DIAGNOSIS — S02841A Fracture of lateral orbital wall, right side, initial encounter for closed fracture: Secondary | ICD-10-CM | POA: Diagnosis not present

## 2024-04-03 DIAGNOSIS — Z7982 Long term (current) use of aspirin: Secondary | ICD-10-CM | POA: Insufficient documentation

## 2024-04-03 DIAGNOSIS — Z043 Encounter for examination and observation following other accident: Secondary | ICD-10-CM | POA: Diagnosis not present

## 2024-04-03 DIAGNOSIS — S0181XA Laceration without foreign body of other part of head, initial encounter: Secondary | ICD-10-CM

## 2024-04-03 DIAGNOSIS — E118 Type 2 diabetes mellitus with unspecified complications: Secondary | ICD-10-CM

## 2024-04-03 DIAGNOSIS — W11XXXA Fall on and from ladder, initial encounter: Secondary | ICD-10-CM | POA: Diagnosis not present

## 2024-04-03 LAB — CBC
HCT: 47.9 % (ref 39.0–52.0)
Hemoglobin: 16.5 g/dL (ref 13.0–17.0)
MCH: 31 pg (ref 26.0–34.0)
MCHC: 34.4 g/dL (ref 30.0–36.0)
MCV: 89.9 fL (ref 80.0–100.0)
Platelets: 205 K/uL (ref 150–400)
RBC: 5.33 MIL/uL (ref 4.22–5.81)
RDW: 12.6 % (ref 11.5–15.5)
WBC: 6.6 K/uL (ref 4.0–10.5)
nRBC: 0 % (ref 0.0–0.2)

## 2024-04-03 LAB — BASIC METABOLIC PANEL WITH GFR
Anion gap: 13 (ref 5–15)
BUN: 17 mg/dL (ref 8–23)
CO2: 22 mmol/L (ref 22–32)
Calcium: 9.8 mg/dL (ref 8.9–10.3)
Chloride: 101 mmol/L (ref 98–111)
Creatinine, Ser: 1.08 mg/dL (ref 0.61–1.24)
GFR, Estimated: >60 mL/min (ref >60)
Glucose, Bld: 156 mg/dL — ABNORMAL HIGH (ref 70–99)
Potassium: 3.9 mmol/L (ref 3.5–5.1)
Sodium: 136 mmol/L (ref 135–145)

## 2024-04-03 MED ORDER — MORPHINE SULFATE (PF) 4 MG/ML IV SOLN: 4.0000 mg | Freq: Once | INTRAVENOUS | Status: DC

## 2024-04-03 MED ORDER — OXYCODONE-ACETAMINOPHEN 5-325 MG PO TABS
1.0000 | ORAL_TABLET | Freq: Once | ORAL | Status: AC
  Administered 2024-04-03: 1 via ORAL
  Filled 2024-04-03: qty 1

## 2024-04-03 MED ORDER — LIDOCAINE HCL (PF) 1 % IJ SOLN
5.0000 mL | Freq: Once | INTRAMUSCULAR | Status: AC
  Administered 2024-04-03: 5 mL via INTRADERMAL
  Filled 2024-04-03: qty 5

## 2024-04-03 NOTE — ED Provider Notes (Signed)
 James Ponce Provider Note   CSN: [id number] Arrival date & time: 04/03/24  1015     Patient presents with: Fall and Head Injury   James Ponce is a 68 y.o. male.  With a history of hypertension hyperlipidemia and type 2 diabetes who presents to the ED after fall.  Patient was working up on a ladder and was about halfway up the ladder (3 feet in the air when he lost his balance and fell.  Struck his head on concrete.  No LOC.  Significant right brow nasal trauma as well as pain in the right ribs and over the left wrist.  He was able to walk after.  Daily aspirin.  No anticoagulation.    Fall  Head Injury      Prior to Admission medications   Medication Sig Start Date End Date Taking? Authorizing Provider  aspirin EC 81 MG tablet Take 81 mg by mouth daily.    [provider]  azelastine  (ASTELIN ) 0.1 % nasal spray USE TWO SPRAYS IN EACH NOSTRIL ONCE A DAY 01/31/24   [name] Butler DASEN, MD  celecoxib  (CELEBREX ) 200 MG capsule Take 1 capsule (200 mg total) by mouth 2 (two) times daily. 07/12/23   [name] Butler DASEN, MD  dapagliflozin  propanediol (FARXIGA ) 10 MG TABS tablet TAKE 1 TABLET BY MOUTH EVERY DAY BEFORE BREAKFAST 02/07/24   [name] Butler DASEN, MD  doxazosin  (CARDURA ) 4 MG tablet Take 1 tablet (4 mg total) by mouth daily. 07/12/23   [name] Butler DASEN, MD  fish oil-omega-3 fatty acids  1000 MG capsule Take 1-2 capsules (1-2 g total) by mouth 2 (two) times daily. 2 capsules in morning, and 1 in evening, daily 07/12/23   [name] Butler DASEN, MD  fluticasone  (FLONASE ) 50 MCG/ACT nasal spray Place 2 sprays into both nostrils daily. 07/12/23   [name] Butler DASEN, MD  Glucosamine-Chondroitin (GLUCOSAMINE CHONDR COMPLEX PO) Take 1 tablet by mouth 2 (two) times daily.    [provider]  glucose blood test strip Use to check blood sugar twice daily.*Dispense Contour next test strips* 07/12/23   [name] Butler DASEN, MD  HYDROcodone  bit-homatropine  [facility]) 5-1.5 MG/5ML syrup Take 5 mLs by mouth every 8 (eight) hours as needed for cough. 12/31/23   [name] Butler DASEN, MD  loratadine  (CLARITIN ) 10 MG tablet Take 1 tablet (10 mg total) by mouth daily. 12/07/13   [name] Butler DASEN, MD  meclizine  (ANTIVERT ) 12.5 MG tablet Take 1 tablet (12.5 mg total) by mouth every 8 (eight) hours as needed for dizziness. 01/27/24   Kayla Jeoffrey RAMAN, FNP  metFORMIN  (GLUCOPHAGE -XR) 500 MG 24 hr tablet Take 4 tablets (2,000 mg total) by mouth daily with breakfast. 07/12/23   [name] Butler DASEN, MD  Multiple Vitamin (MULTIVITAMIN) tablet Take 1 tablet by mouth daily.    [provider]  promethazine -dextromethorphan (PROMETHAZINE -DM) 6.25-15 MG/5ML syrup Take 5 mLs by mouth 4 (four) times daily as needed for cough. 07/31/22   White, Shelba SAUNDERS, NP  rosuvastatin  (CRESTOR ) 10 MG tablet Take 1 tablet (10 mg total) by mouth daily. 07/12/23   [name] Butler DASEN, MD  sildenafil  (VIAGRA ) 100 MG tablet Take 0.5-1 tablets (50-100 mg total) by mouth daily as needed for erectile dysfunction. 07/12/23   [name] Butler DASEN, MD  tirzepatide  (MOUNJARO ) 12.5 MG/0.5ML Pen Inject 12.5 mg into the skin once a week. 02/24/24   [name] Butler DASEN, MD  valsartan -hydrochlorothiazide  (DIOVAN -HCT) 320-25 MG tablet Take 1 tablet by mouth daily. 07/12/23  [name] Butler DASEN, MD    Allergies: Lipitor [atorvastatin] and Sulfa antibiotics    Review of Systems  Updated Vital Signs BP (!) 145/78 (BP Location: Right Arm)   Pulse 81   Temp (!) 97.5 F (36.4 C)   Resp 17   Ht 5' 8 (1.727 m)   Wt 108.9 kg   SpO2 98%   BMI 36.49 kg/m   Physical Exam Vitals and nursing note reviewed.  HENT:     Head:     Comments: Laceration through right brow approximately 3 cm linear Abrasion over nasal bridge without deformity No septal hematoma  Eyes:     Pupils: Pupils are equal, round, and reactive to light.  Cardiovascular:     Rate and Rhythm: Normal rate and regular rhythm.  Pulmonary:      Effort: Pulmonary effort is normal.     Breath sounds: Normal breath sounds.  Abdominal:     Palpations: Abdomen is soft.     Tenderness: There is no abdominal tenderness.  Musculoskeletal:     Cervical back: Neck supple. No tenderness.     Comments: Tenderness over distal radius/dorsal aspect of the left hand with some ecchymosis and edema No deformity Sensation tact light touch throughout bilateral upper and lower extremities Full active range of motion and strength in bilateral lower extremities No midline tenderness step-off deformity back  Skin:    General: Skin is warm and dry.  Neurological:     Mental Status: He is alert.  Psychiatric:        Mood and Affect: Mood normal.     (all labs ordered are listed, but only abnormal results are displayed) Labs Reviewed  BASIC METABOLIC PANEL WITH GFR - Abnormal; Notable for the following components:      Result Value   Glucose, Bld 156 (*)    All other components within normal limits  CBC    EKG: None  Radiology: DG Wrist Complete Left Result Date: 04/03/2024 CLINICAL DATA:  Pain, fell EXAM: LEFT WRIST - COMPLETE 3+ VIEW COMPARISON:  None Available. FINDINGS: Frontal, oblique, lateral, and ulnar deviated views of the left wrist are obtained. No fracture, subluxation, or dislocation. There is diffuse osteoarthritis, greatest within the radial aspect of the carpus. Large subchondral cysts are seen within the scaphoid and lunate. Mild diffuse soft tissue swelling. IMPRESSION: 1. No acute displaced fracture. 2. Diffuse osteoarthritis. 3. Mild soft tissue swelling. Electronically Signed   By: Ozell Daring M.D.   On: 04/03/2024 12:56   DG Ribs Unilateral W/Chest Right Result Date: 04/03/2024 CLINICAL DATA:  Right lateral rib pain after fall EXAM: RIGHT RIBS AND CHEST - 3+ VIEW COMPARISON:  08/07/2022 FINDINGS: Frontal view of the chest as well as frontal and oblique views of the right thoracic cage are obtained. The cardiac silhouette  is unremarkable. No airspace disease, effusion, or pneumothorax. There are minimally displaced right anterolateral seventh and eighth rib fractures, best seen on the frontal projections. IMPRESSION: 1. Minimally displaced right anterolateral seventh and eighth rib fractures. 2. No effusion or pneumothorax. Electronically Signed   By: Ozell Daring M.D.   On: 04/03/2024 12:55   DG Hand Complete Left Result Date: 04/03/2024 CLINICAL DATA:  [place] EXAM: LEFT HAND - COMPLETE 3+ VIEW COMPARISON:  None Available. FINDINGS: Frontal, oblique, and lateral views of the left hand are obtained. No acute fracture, subluxation, or dislocation. There is diffuse osteoarthritis, with joint space narrowing and osteophyte formation greatest throughout the distal interphalangeal joints. Soft tissues are  unremarkable. IMPRESSION: 1. No acute displaced fracture. 2. Diffuse osteoarthritis, most pronounced throughout the distal interphalangeal joints. Electronically Signed   By: Ozell Daring M.D.   On: 04/03/2024 12:54   CT Head Wo Contrast Result Date: 04/03/2024 CLINICAL DATA:  Provided history: Head trauma, moderate/severe. Facial trauma, 1. Neck trauma. Additional history provided: Right brow and nasal trauma. EXAM: CT HEAD WITHOUT CONTRAST CT MAXILLOFACIAL WITHOUT CONTRAST CT CERVICAL SPINE WITHOUT CONTRAST TECHNIQUE: Multidetector CT imaging of the head, cervical spine, and maxillofacial structures were performed using the standard protocol without intravenous contrast. Multiplanar CT image reconstructions of the cervical spine and maxillofacial structures were also generated. RADIATION DOSE REDUCTION: This exam was performed according to the departmental dose-optimization program which includes automated exposure control, adjustment of the mA and/or kV according to patient size and/or use of iterative reconstruction technique. COMPARISON:  None. FINDINGS: CT HEAD FINDINGS Brain: There is no acute intracranial hemorrhage. No  demarcated cortical infarct. No extra-axial fluid collection. No evidence of an intracranial mass. No midline shift. Vascular: No hyperdense vessel.  Atherosclerotic calcifications. Skull: No calvarial fracture or aggressive osseous lesion. CT MAXILLOFACIAL FINDINGS Osseous: Mildly displaced fracture of the lateral wall of the right bony orbit. Comminuted and displaced fractures of the anterior and posterolateral walls of the right maxillary sinus. Fracture of the right orbital floor with up to 2 mm of depression at this site. Nondisplaced fracture of the right inferior orbital rim. Displaced fracture of the lateral right pterygoid plate. Orbits: Right periorbital hematoma/laceration. Small foci of gas within the extraconal right orbit inferiorly, and within the right periorbital soft tissues. The globes are normal in size and contour. No appreciable retrobulbar hematoma. Sinuses: Hyperdense fluid level within the right maxillary sinus consistent with hemorrhage. Mild background mucosal thickening within the right maxillary sinus. Mild mucosal thickening within the left maxillary sinus. Mild bilateral ethmoid sinus disease. Mild mucosal thickening within the bilateral frontal sinuses. Soft tissues: Right periorbital hematoma/laceration. Foci of soft tissue gas surrounding the right maxillary sinus. CT CERVICAL SPINE FINDINGS Alignment: Nonspecific straightening of the expected cervical lordosis. No significant spondylolisthesis. Skull base and vertebrae: The basion-dental and atlanto-dental intervals are maintained.No evidence of acute fracture to the cervical spine. Soft tissues and spinal canal: No prevertebral fluid or swelling. No visible canal hematoma. Disc levels: Cervical spondylosis with multilevel disc space narrowing, disc bulges/central disc protrusions, endplate spurring, uncovertebral hypertrophy and facet arthropathy. Disc space narrowing is greatest at C5-C6 (moderate at this level). No appreciable  high-grade spinal canal stenosis. Multilevel bony neural foraminal narrowing. Multilevel ventral osteophytes (including a bridging ventral osteophyte at C7-T1). Degenerative changes also present at the C1-C2 articulation. Upper chest: No consolidation within the imaged lung apices. No visible pneumothorax. IMPRESSION: CT head: No evidence of an acute intracranial abnormality. CT maxillofacial: 1. Mildly displaced fracture of the lateral wall of the right bony orbit. 2. Comminuted and displaced fractures of the anterior and posterolateral walls of the right maxillary sinus. 3. Fracture of the right orbital floor with up to 2 mm of fracture depression. 4. Nondisplaced fracture of the right inferior orbital rim. 5. Displaced fracture of the lateral right pterygoid plate. 6. Right periorbital hematoma/laceration. 7. Small foci of gas within the extraconal right orbit inferiorly. 8. Paranasal sinus disease as described (including a hemorrhage level within the right maxillary sinus). CT cervical spine: 1. No evidence of an acute cervical spine fracture. 2. Known straightening of the expected cervical lordosis. 3. Cervical spondylosis as described. Electronically Signed   By: Rockey  Richelle D.O.   On: 04/03/2024 12:32   CT Cervical Spine Wo Contrast Result Date: 04/03/2024 CLINICAL DATA:  Provided history: Head trauma, moderate/severe. Facial trauma, 1. Neck trauma. Additional history provided: Right brow and nasal trauma. EXAM: CT HEAD WITHOUT CONTRAST CT MAXILLOFACIAL WITHOUT CONTRAST CT CERVICAL SPINE WITHOUT CONTRAST TECHNIQUE: Multidetector CT imaging of the head, cervical spine, and maxillofacial structures were performed using the standard protocol without intravenous contrast. Multiplanar CT image reconstructions of the cervical spine and maxillofacial structures were also generated. RADIATION DOSE REDUCTION: This exam was performed according to the departmental dose-optimization program which includes automated  exposure control, adjustment of the mA and/or kV according to patient size and/or use of iterative reconstruction technique. COMPARISON:  None. FINDINGS: CT HEAD FINDINGS Brain: There is no acute intracranial hemorrhage. No demarcated cortical infarct. No extra-axial fluid collection. No evidence of an intracranial mass. No midline shift. Vascular: No hyperdense vessel.  Atherosclerotic calcifications. Skull: No calvarial fracture or aggressive osseous lesion. CT MAXILLOFACIAL FINDINGS Osseous: Mildly displaced fracture of the lateral wall of the right bony orbit. Comminuted and displaced fractures of the anterior and posterolateral walls of the right maxillary sinus. Fracture of the right orbital floor with up to 2 mm of depression at this site. Nondisplaced fracture of the right inferior orbital rim. Displaced fracture of the lateral right pterygoid plate. Orbits: Right periorbital hematoma/laceration. Small foci of gas within the extraconal right orbit inferiorly, and within the right periorbital soft tissues. The globes are normal in size and contour. No appreciable retrobulbar hematoma. Sinuses: Hyperdense fluid level within the right maxillary sinus consistent with hemorrhage. Mild background mucosal thickening within the right maxillary sinus. Mild mucosal thickening within the left maxillary sinus. Mild bilateral ethmoid sinus disease. Mild mucosal thickening within the bilateral frontal sinuses. Soft tissues: Right periorbital hematoma/laceration. Foci of soft tissue gas surrounding the right maxillary sinus. CT CERVICAL SPINE FINDINGS Alignment: Nonspecific straightening of the expected cervical lordosis. No significant spondylolisthesis. Skull base and vertebrae: The basion-dental and atlanto-dental intervals are maintained.No evidence of acute fracture to the cervical spine. Soft tissues and spinal canal: No prevertebral fluid or swelling. No visible canal hematoma. Disc levels: Cervical spondylosis with  multilevel disc space narrowing, disc bulges/central disc protrusions, endplate spurring, uncovertebral hypertrophy and facet arthropathy. Disc space narrowing is greatest at C5-C6 (moderate at this level). No appreciable high-grade spinal canal stenosis. Multilevel bony neural foraminal narrowing. Multilevel ventral osteophytes (including a bridging ventral osteophyte at C7-T1). Degenerative changes also present at the C1-C2 articulation. Upper chest: No consolidation within the imaged lung apices. No visible pneumothorax. IMPRESSION: CT head: No evidence of an acute intracranial abnormality. CT maxillofacial: 1. Mildly displaced fracture of the lateral wall of the right bony orbit. 2. Comminuted and displaced fractures of the anterior and posterolateral walls of the right maxillary sinus. 3. Fracture of the right orbital floor with up to 2 mm of fracture depression. 4. Nondisplaced fracture of the right inferior orbital rim. 5. Displaced fracture of the lateral right pterygoid plate. 6. Right periorbital hematoma/laceration. 7. Small foci of gas within the extraconal right orbit inferiorly. 8. Paranasal sinus disease as described (including a hemorrhage level within the right maxillary sinus). CT cervical spine: 1. No evidence of an acute cervical spine fracture. 2. Known straightening of the expected cervical lordosis. 3. Cervical spondylosis as described. Electronically Signed   By: Rockey Richelle D.O.   On: 04/03/2024 12:32   CT Maxillofacial Wo Contrast Result Date: 04/03/2024 CLINICAL DATA:  Provided history: Head  trauma, moderate/severe. Facial trauma, 1. Neck trauma. Additional history provided: Right brow and nasal trauma. EXAM: CT HEAD WITHOUT CONTRAST CT MAXILLOFACIAL WITHOUT CONTRAST CT CERVICAL SPINE WITHOUT CONTRAST TECHNIQUE: Multidetector CT imaging of the head, cervical spine, and maxillofacial structures were performed using the standard protocol without intravenous contrast. Multiplanar CT image  reconstructions of the cervical spine and maxillofacial structures were also generated. RADIATION DOSE REDUCTION: This exam was performed according to the departmental dose-optimization program which includes automated exposure control, adjustment of the mA and/or kV according to patient size and/or use of iterative reconstruction technique. COMPARISON:  None. FINDINGS: CT HEAD FINDINGS Brain: There is no acute intracranial hemorrhage. No demarcated cortical infarct. No extra-axial fluid collection. No evidence of an intracranial mass. No midline shift. Vascular: No hyperdense vessel.  Atherosclerotic calcifications. Skull: No calvarial fracture or aggressive osseous lesion. CT MAXILLOFACIAL FINDINGS Osseous: Mildly displaced fracture of the lateral wall of the right bony orbit. Comminuted and displaced fractures of the anterior and posterolateral walls of the right maxillary sinus. Fracture of the right orbital floor with up to 2 mm of depression at this site. Nondisplaced fracture of the right inferior orbital rim. Displaced fracture of the lateral right pterygoid plate. Orbits: Right periorbital hematoma/laceration. Small foci of gas within the extraconal right orbit inferiorly, and within the right periorbital soft tissues. The globes are normal in size and contour. No appreciable retrobulbar hematoma. Sinuses: Hyperdense fluid level within the right maxillary sinus consistent with hemorrhage. Mild background mucosal thickening within the right maxillary sinus. Mild mucosal thickening within the left maxillary sinus. Mild bilateral ethmoid sinus disease. Mild mucosal thickening within the bilateral frontal sinuses. Soft tissues: Right periorbital hematoma/laceration. Foci of soft tissue gas surrounding the right maxillary sinus. CT CERVICAL SPINE FINDINGS Alignment: Nonspecific straightening of the expected cervical lordosis. No significant spondylolisthesis. Skull base and vertebrae: The basion-dental and  atlanto-dental intervals are maintained.No evidence of acute fracture to the cervical spine. Soft tissues and spinal canal: No prevertebral fluid or swelling. No visible canal hematoma. Disc levels: Cervical spondylosis with multilevel disc space narrowing, disc bulges/central disc protrusions, endplate spurring, uncovertebral hypertrophy and facet arthropathy. Disc space narrowing is greatest at C5-C6 (moderate at this level). No appreciable high-grade spinal canal stenosis. Multilevel bony neural foraminal narrowing. Multilevel ventral osteophytes (including a bridging ventral osteophyte at C7-T1). Degenerative changes also present at the C1-C2 articulation. Upper chest: No consolidation within the imaged lung apices. No visible pneumothorax. IMPRESSION: CT head: No evidence of an acute intracranial abnormality. CT maxillofacial: 1. Mildly displaced fracture of the lateral wall of the right bony orbit. 2. Comminuted and displaced fractures of the anterior and posterolateral walls of the right maxillary sinus. 3. Fracture of the right orbital floor with up to 2 mm of fracture depression. 4. Nondisplaced fracture of the right inferior orbital rim. 5. Displaced fracture of the lateral right pterygoid plate. 6. Right periorbital hematoma/laceration. 7. Small foci of gas within the extraconal right orbit inferiorly. 8. Paranasal sinus disease as described (including a hemorrhage level within the right maxillary sinus). CT cervical spine: 1. No evidence of an acute cervical spine fracture. 2. Known straightening of the expected cervical lordosis. 3. Cervical spondylosis as described. Electronically Signed   By: Rockey Childs D.O.   On: 04/03/2024 12:32     Procedures   Medications Ordered in the ED  oxyCODONE -acetaminophen  (PERCOCET/ROXICET) 5-325 MG per tablet 1 tablet (1 tablet Oral Given 04/03/24 1125)  lidocaine (PF) (XYLOCAINE) 1 % injection 5 mL (5 mLs  Intradermal Given 04/03/24 1316)    Clinical Course as  of 04/03/24 1434  Mon Apr 03, 2024  1326 +facial fx. ENT. + rib fxs [MP]  1330 Labs: Labs were reviewed CT imaging.  No intracranial findings.  Multiple facial fractures including displaced lateral wall of the right orbit displaced fractures of anterior and posterior lateral walls of the right maxillary sinus, fracture of the right orbital floor, nondisplaced fracture of the right inferior orbital rim, displaced fracture of the lateral right pterygoid plate.  No evidence of cervical spine injury.  Minimally displaced right 7th and 8th rib fractures anterolaterally  Left hand and wrist are okay without fracture   [MP]  1331 Discussed the facial fractures with Dr. Tobie, (ENT/facial trauma on-call).  No need for operative management at this time.  Will discharge with sinus precautions including soft diet until cleared by ENT in 2 weeks.  Antibiotics not indicated at this time. [MP]  1429 Right brow laceration repaired by PA Bundy.  8 Prolene stitches placed.  Informed patient and his wife of his injuries.  Counseled patient on sinus precautions.  Will discharge with incentive spirometer with the hope of preventing pneumonia.  He declined offer of opioid analgesic.  Will follow-up with ENT in 2 weeks [MP]    Clinical Course User Index [MP] Pamella Ozell LABOR, DO                                 Medical Decision Making 69 year old male with history as above presenting after fall off of a ladder.  Was approximately 2 to 3 feet in the air fell.  Struck right face right ribs and reporting left wrist pain as well.  3 cm lack of through right brow.  No loss of consciousness.  Will obtain CT head C-spine max face as well as rib series to look for fractures on the right and left hand left wrist x-ray to look for fractures.  Tetanus is up-to-date.  Will plan for laceration repair after imaging  Amount and/or Complexity of Data Reviewed Labs: ordered. Radiology: ordered.  Risk Prescription drug  management.        Final diagnoses:  Facial laceration, initial encounter  Closed fracture of orbit, initial encounter (HCC)  Closed sphenoid sinus fracture, initial encounter (HCC)  Pterygoid plate fracture (HCC)  Closed fracture of multiple ribs of right side, initial encounter    ED Discharge Orders     None          Xaiden, Fleig, DO 04/03/24 1434

## 2024-04-03 NOTE — ED Triage Notes (Addendum)
 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 Denies LOC, does have a lac the length of his Rt eye brow that is covered with a clean gauze dressing, bleeding stopped. Pupils equal & reactive, no vision changes. A/Ox4, rates his pain 8/10 during triage, not on thinners, does take baby ASA daily.

## 2024-04-03 NOTE — Discharge Instructions (Addendum)
 You were seen in the emergency department for injuries after a fall from height There are multiple fractured bones in the right face including the sinuses and orbital wall These do not require surgery right now Maintain a soft diet for the next 2 weeks Follow-up with Dr. Tobie in the office 2 weeks from now to ensure you are getting better Do not blow your nose until cleared by ENT Do not drive for 1 week as your eye is healing The stitches will need to come out in 8 to 10 days Be on the look out for signs of infection Continue to use the incentive spirometer provided as discussed to help prevent pneumonia from your rib fractures Take Tylenol  Motrin as directed for pain Return to the emergency department for changes in vision, trouble breathing, severe pain or any other concerns

## 2024-04-03 NOTE — ED Provider Notes (Signed)
   Procedures  .Laceration Repair  Date/Time: 04/03/2024 2:35 PM  Performed by: Myriam Fonda RAMAN, PA-C Authorized by: Myriam Fonda RAMAN, PA-C   Consent:    Consent obtained:  Verbal and written   Consent given by:  Patient   Risks, benefits, and alternatives were discussed: yes     Risks discussed:  Infection, need for additional repair, poor cosmetic result, pain, retained foreign body, tendon damage, nerve damage, poor wound healing and vascular damage   Alternatives discussed:  No treatment Universal protocol:    Procedure explained and questions answered to patient or proxy's satisfaction: yes     Relevant documents present and verified: yes     Test results available: yes     Imaging studies available: yes     Required blood products, implants, devices, and special equipment available: yes     Patient identity confirmed:  Verbally with patient and arm band Anesthesia:    Anesthesia method:  Local infiltration   Local anesthetic:  Lidocaine 1% w/o epi Laceration details:    Location:  Face   Face location:  R eyebrow   Length (cm):  5   Depth (mm):  4 Pre-procedure details:    Preparation:  Patient was prepped and draped in usual sterile fashion Exploration:    Hemostasis achieved with:  Epinephrine and direct pressure   Contaminated: no   Treatment:    Area cleansed with:  Saline   Amount of cleaning:  Standard   Irrigation solution:  Sterile saline   Irrigation volume:    Irrigation method:  Syringe   Debridement:  None   Undermining:  None Skin repair:    Repair method:  Sutures   Suture size:  5-0   Suture material:  Prolene   Suture technique:  Simple interrupted   Number of sutures:  8 Approximation:    Approximation:  Close Repair type:    Repair type:  Simple Post-procedure details:    Dressing:  Open (no dressing)   Procedure completion:  Tolerated well, no immediate complications          Myriam Fonda RAMAN, PA-C 04/03/24 1440    Pamella Ozell LABOR, DO 04/07/24 0018

## 2024-04-03 NOTE — ED Triage Notes (Addendum)
 Pt fell off of combine this morning and hit head on concrete. Obvious bruising under right eye and presumed lac under gauze on right side of head. Pt states no LOC. Pt c/o right side pain and left hand pain. Pt AOX4. Not on blood thinners.

## 2024-04-03 NOTE — Telephone Encounter (Signed)
 Prescription Request  04/03/2024  LOV: 12/16/2023  What is the name of the medication or equipment?   metFORMIN  (GLUCOPHAGE -XR) 500 MG 24 hr tablet   Have you contacted your pharmacy to request a refill? Yes   Which pharmacy would you like this sent to?  [facility] - [place], [place] - [address] [place] [address] Phone: [phone] Fax: [phone]    Patient notified that their request is being sent to the clinical staff for review and that they should receive a response within 2 business days.   Please advise pharmacist.

## 2024-04-03 NOTE — ED Notes (Signed)
 ED PA at bedside

## 2024-04-04 ENCOUNTER — Telehealth (INDEPENDENT_AMBULATORY_CARE_PROVIDER_SITE_OTHER): Payer: Self-pay | Admitting: Otolaryngology

## 2024-04-04 NOTE — Telephone Encounter (Signed)
 LVM for patient to call back to schedule appointment with Dr. Tobie in 2-3 wks - Facial Trauma ED Follow-up

## 2024-04-05 DIAGNOSIS — E119 Type 2 diabetes mellitus without complications: Secondary | ICD-10-CM | POA: Diagnosis not present

## 2024-04-05 NOTE — Telephone Encounter (Signed)
 Requested medication (s) are due for refill today: yes  Requested medication (s) are on the active medication list: yes  Last refill:  07/12/23  Future visit scheduled: yes  Notes to clinic:  Unable to refill per protocol due to failed labs, no updated A1c results.      Requested Prescriptions  Pending Prescriptions Disp Refills   metFORMIN  (GLUCOPHAGE -XR) 500 MG 24 hr tablet 360 tablet 2    Sig: Take 4 tablets (2,000 mg total) by mouth daily with breakfast.     Endocrinology:  Diabetes - Biguanides Failed - 04/05/2024 11:05 AM      Failed - HBA1C is between 0 and 7.9 and within 180 days    Hgb A1C (fingerstick)  Date Value Ref Range Status  12/28/2014 6.7 (H) <5.7 % Final    Comment:                                                                           According to the ADA Clinical Practice Recommendations for 2011, when HbA1c is used as a screening test:     >=6.5%   Diagnostic of Diabetes Mellitus            (if abnormal result is confirmed)   5.7-6.4%   Increased risk of developing Diabetes Mellitus   References:Diagnosis and Classification of Diabetes Mellitus,Diabetes Care,2011,34(Suppl 1):S62-S69 and Standards of Medical Care in         Diabetes - 2011,Diabetes Care,2011,34 (Suppl 1):S11-S61.      Hgb A1c MFr Bld  Date Value Ref Range Status  06/11/2023 6.1 (H) <5.7 % of total Hgb Final    Comment:    For someone without known diabetes, a hemoglobin  A1c value between 5.7% and 6.4% is consistent with prediabetes and should be confirmed with a  follow-up test. . For someone with known diabetes, a value <7% indicates that their diabetes is well controlled. A1c targets should be individualized based on duration of diabetes, age, comorbid conditions, and other considerations. . This assay result is consistent with an increased risk of diabetes. . Currently, no consensus exists regarding use of hemoglobin A1c for diagnosis of diabetes for children. .           Passed - Cr in normal range and within 360 days    Creat  Date Value Ref Range Status  01/27/2024 0.93 0.70 - 1.35 mg/dL Final   Creatinine, Ser  Date Value Ref Range Status  04/03/2024 1.08 0.61 - 1.24 mg/dL Final   Creatinine, Urine  Date Value Ref Range Status  06/11/2023 102 20 - 320 mg/dL Final         Passed - eGFR in normal range and within 360 days    GFR, Est African American  Date Value Ref Range Status  03/25/2020 110 > OR = 60 mL/min/1.82m2 Final   GFR, Est Non African American  Date Value Ref Range Status  03/25/2020 95 > OR = 60 mL/min/1.33m2 Final   GFR, Estimated  Date Value Ref Range Status  04/03/2024 >60 >60 mL/min Final    Comment:    (NOTE) Calculated using the CKD-EPI Creatinine Equation (2021)    eGFR  Date Value Ref Range Status  01/27/2024 91 >  OR = 60 mL/min/1.57m2 Final         Passed - B12 Level in normal range and within 720 days    Vitamin B-12  Date Value Ref Range Status  06/11/2023 516 200 - 1,100 pg/mL Final         Passed - Valid encounter within last 6 months    Recent Outpatient Visits           2 months ago BPPV (benign paroxysmal positional vertigo), bilateral   [place] [facility] Kayla Jeoffrey RAMAN, FNP   3 months ago Controlled type 2 diabetes mellitus with complication, without long-term current use of insulin (HCC)   [place] [facility] Pickard, Butler DASEN, MD   9 months ago General medical exam   [place] [facility] [name] Butler DASEN, MD   1 year ago Acute non-recurrent frontal sinusitis   [place] [facility] Kayla Jeoffrey RAMAN, FNP   1 year ago Acute non-recurrent frontal sinusitis   [place] [facility] Pickard, Butler DASEN, MD              Passed - CBC within normal limits and completed in the last 12 months    WBC  Date Value Ref Range Status  04/03/2024 6.6 4.0 - 10.5 K/uL Final   RBC  Date Value Ref  Range Status  04/03/2024 5.33 4.22 - 5.81 MIL/uL Final   Hemoglobin  Date Value Ref Range Status  04/03/2024 16.5 13.0 - 17.0 g/dL Final   HCT  Date Value Ref Range Status  04/03/2024 47.9 39.0 - 52.0 % Final   MCHC  Date Value Ref Range Status  04/03/2024 34.4 30.0 - 36.0 g/dL Final   [facility]  Date Value Ref Range Status  04/03/2024 31.0 26.0 - 34.0 pg Final   MCV  Date Value Ref Range Status  04/03/2024 89.9 80.0 - 100.0 fL Final   No results found for: PLTCOUNTKUC, LABPLAT, POCPLA RDW  Date Value Ref Range Status  04/03/2024 12.6 11.5 - 15.5 % Final

## 2024-04-06 MED ORDER — METFORMIN HCL ER 500 MG PO TB24: 2000.0000 mg | ORAL_TABLET | Freq: Every day | ORAL | 1 refills | Status: AC

## 2024-04-11 ENCOUNTER — Ambulatory Visit: Admitting: Family Medicine

## 2024-04-11 ENCOUNTER — Encounter: Payer: Self-pay | Admitting: Family Medicine

## 2024-04-11 VITALS — BP 122/62 | HR 100 | Temp 97.6°F | Ht 68.0 in | Wt 241.0 lb

## 2024-04-11 DIAGNOSIS — S0181XD Laceration without foreign body of other part of head, subsequent encounter: Secondary | ICD-10-CM

## 2024-04-11 DIAGNOSIS — Z4802 Encounter for removal of sutures: Secondary | ICD-10-CM | POA: Diagnosis not present

## 2024-04-11 DIAGNOSIS — Z23 Encounter for immunization: Secondary | ICD-10-CM

## 2024-04-11 DIAGNOSIS — Z1211 Encounter for screening for malignant neoplasm of colon: Secondary | ICD-10-CM | POA: Insufficient documentation

## 2024-04-11 NOTE — Addendum Note (Signed)
 Addended by: ANGELENA RONAL BRADLEY K on: 04/11/2024 03:42 PM   Modules accepted: Orders

## 2024-04-11 NOTE — Progress Notes (Signed)
 Subjective:    Patient ID: James Ponce, male    DOB: 12-24-1955, 68 y.o.   MRN: [id number]  September 29, the patient fell off a ladder approximately 3 feet in the air and landed striking his head on concrete.  He sustained multiple facial fractures, rib fractures, and a laceration above his right eyebrow requiring sutures.  I have copied the emergency room physician's note: Expand All Collapse All   [facility] Provider Note     CSN: [id number] Arrival date & time: 04/03/24  1015     Patient presents with: Fall and Head Injury     James Ponce is a 68 y.o. male.  With a history of hypertension hyperlipidemia and type 2 diabetes who presents to the ED after fall.  Patient was working up on a ladder and was about halfway up the ladder (3 feet in the air when he lost his balance and fell.  Struck his head on concrete.  No LOC.  Significant right brow nasal trauma as well as pain in the right ribs and over the left wrist.  He was able to walk after.  Daily aspirin.  No anticoagulation.       Fall  Head Injury             Prior to Admission medications   Medication Sig Start Date End Date Taking? Authorizing Provider  aspirin EC 81 MG tablet Take 81 mg by mouth daily.       [provider]  azelastine  (ASTELIN ) 0.1 % nasal spray USE TWO SPRAYS IN EACH NOSTRIL ONCE A DAY 01/31/24     [name] Butler DASEN, MD  celecoxib  (CELEBREX ) 200 MG capsule Take 1 capsule (200 mg total) by mouth 2 (two) times daily. 07/12/23     [name] Butler DASEN, MD  dapagliflozin  propanediol (FARXIGA ) 10 MG TABS tablet TAKE 1 TABLET BY MOUTH EVERY DAY BEFORE BREAKFAST 02/07/24     [name] Butler DASEN, MD  doxazosin  (CARDURA ) 4 MG tablet Take 1 tablet (4 mg total) by mouth daily. 07/12/23     [name] Butler DASEN, MD  fish oil-omega-3 fatty acids  1000 MG capsule Take 1-2 capsules (1-2 g total) by mouth 2 (two) times daily. 2 capsules in morning, and 1 in evening, daily  07/12/23     [name] Butler DASEN, MD  fluticasone  (FLONASE ) 50 MCG/ACT nasal spray Place 2 sprays into both nostrils daily. 07/12/23     [name] Butler DASEN, MD  Glucosamine-Chondroitin (GLUCOSAMINE CHONDR COMPLEX PO) Take 1 tablet by mouth 2 (two) times daily.       [provider]  glucose blood test strip Use to check blood sugar twice daily.*Dispense Contour next test strips* 07/12/23     [name] Butler DASEN, MD  HYDROcodone  bit-homatropine (HYCODAN) 5-1.5 MG/5ML syrup Take 5 mLs by mouth every 8 (eight) hours as needed for cough. 12/31/23     [name] Butler DASEN, MD  loratadine  (CLARITIN ) 10 MG tablet Take 1 tablet (10 mg total) by mouth daily. 12/07/13     [name] Butler DASEN, MD  meclizine  (ANTIVERT ) 12.5 MG tablet Take 1 tablet (12.5 mg total) by mouth every 8 (eight) hours as needed for dizziness. 01/27/24     Kayla Jeoffrey RAMAN, FNP  metFORMIN  (GLUCOPHAGE -XR) 500 MG 24 hr tablet Take 4 tablets (2,000 mg total) by mouth daily with breakfast. 07/12/23     [name] Butler DASEN, MD  Multiple Vitamin (MULTIVITAMIN) tablet Take 1 tablet by mouth daily.  [provider]  promethazine -dextromethorphan (PROMETHAZINE -DM) 6.25-15 MG/5ML syrup Take 5 mLs by mouth 4 (four) times daily as needed for cough. 07/31/22     White, Shelba SAUNDERS, NP  rosuvastatin  (CRESTOR ) 10 MG tablet Take 1 tablet (10 mg total) by mouth daily. 07/12/23     [name] Butler DASEN, MD  sildenafil  (VIAGRA ) 100 MG tablet Take 0.5-1 tablets (50-100 mg total) by mouth daily as needed for erectile dysfunction. 07/12/23     [name] Butler DASEN, MD  tirzepatide  (MOUNJARO ) 12.5 MG/0.5ML Pen Inject 12.5 mg into the skin once a week. 02/24/24     [name] Butler DASEN, MD  valsartan -hydrochlorothiazide  (DIOVAN -HCT) 320-25 MG tablet Take 1 tablet by mouth daily. 07/12/23     [name] Butler DASEN, MD      Allergies: Lipitor [atorvastatin] and Sulfa antibiotics     Review of Systems   Updated Vital Signs BP (!) 145/78 (BP Location: Right Arm)   Pulse 81   Temp  (!) 97.5 F (36.4 C)   Resp 17   Ht 5' 8 (1.727 m)   Wt 108.9 kg   SpO2 98%   BMI 36.49 kg/m    Physical Exam Vitals and nursing note reviewed.  HENT:     Head:     Comments: Laceration through right brow approximately 3 cm linear Abrasion over nasal bridge without deformity No septal hematoma   Eyes:     Pupils: Pupils are equal, round, and reactive to light.  Cardiovascular:     Rate and Rhythm: Normal rate and regular rhythm.  Pulmonary:     Effort: Pulmonary effort is normal.     Breath sounds: Normal breath sounds.  Abdominal:     Palpations: Abdomen is soft.     Tenderness: There is no abdominal tenderness.  Musculoskeletal:     Cervical back: Neck supple. No tenderness.     Comments: Tenderness over distal radius/dorsal aspect of the left hand with some ecchymosis and edema No deformity Sensation tact light touch throughout bilateral upper and lower extremities Full active range of motion and strength in bilateral lower extremities No midline tenderness step-off deformity back  Skin:    General: Skin is warm and dry.  Neurological:     Mental Status: He is alert.  Psychiatric:        Mood and Affect: Mood normal.       (all labs ordered are listed, but only abnormal results are displayed)      Labs Reviewed  BASIC METABOLIC PANEL WITH GFR - Abnormal; Notable for the following components:      Result Value     Glucose, Bld 156 (*)      All other components within normal limits  CBC      EKG: None   Radiology:  Imaging Results (Last 48 hours)  DG Wrist Complete Left Result Date: 04/03/2024 CLINICAL DATA:  Pain, fell EXAM: LEFT WRIST - COMPLETE 3+ VIEW COMPARISON:  None Available. FINDINGS: Frontal, oblique, lateral, and ulnar deviated views of the left wrist are obtained. No fracture, subluxation, or dislocation. There is diffuse osteoarthritis, greatest within the radial aspect of the carpus. Large subchondral cysts are seen within the scaphoid and  lunate. Mild diffuse soft tissue swelling. IMPRESSION: 1. No acute displaced fracture. 2. Diffuse osteoarthritis. 3. Mild soft tissue swelling. Electronically Signed   By: Ozell Daring M.D.   On: 04/03/2024 12:56    DG Ribs Unilateral W/Chest Right Result Date: 04/03/2024 CLINICAL DATA:  Right lateral rib pain  after fall EXAM: RIGHT RIBS AND CHEST - 3+ VIEW COMPARISON:  08/07/2022 FINDINGS: Frontal view of the chest as well as frontal and oblique views of the right thoracic cage are obtained. The cardiac silhouette is unremarkable. No airspace disease, effusion, or pneumothorax. There are minimally displaced right anterolateral seventh and eighth rib fractures, best seen on the frontal projections. IMPRESSION: 1. Minimally displaced right anterolateral seventh and eighth rib fractures. 2. No effusion or pneumothorax. Electronically Signed   By: Ozell Daring M.D.   On: 04/03/2024 12:55    DG Hand Complete Left Result Date: 04/03/2024 CLINICAL DATA:  [place] EXAM: LEFT HAND - COMPLETE 3+ VIEW COMPARISON:  None Available. FINDINGS: Frontal, oblique, and lateral views of the left hand are obtained. No acute fracture, subluxation, or dislocation. There is diffuse osteoarthritis, with joint space narrowing and osteophyte formation greatest throughout the distal interphalangeal joints. Soft tissues are unremarkable. IMPRESSION: 1. No acute displaced fracture. 2. Diffuse osteoarthritis, most pronounced throughout the distal interphalangeal joints. Electronically Signed   By: Ozell Daring M.D.   On: 04/03/2024 12:54    CT Head Wo Contrast Result Date: 04/03/2024 CLINICAL DATA:  Provided history: Head trauma, moderate/severe. Facial trauma, 1. Neck trauma. Additional history provided: Right brow and nasal trauma. EXAM: CT HEAD WITHOUT CONTRAST CT MAXILLOFACIAL WITHOUT CONTRAST CT CERVICAL SPINE WITHOUT CONTRAST TECHNIQUE: Multidetector CT imaging of the head, cervical spine, and maxillofacial structures were  performed using the standard protocol without intravenous contrast. Multiplanar CT image reconstructions of the cervical spine and maxillofacial structures were also generated. RADIATION DOSE REDUCTION: This exam was performed according to the departmental dose-optimization program which includes automated exposure control, adjustment of the mA and/or kV according to patient size and/or use of iterative reconstruction technique. COMPARISON:  None. FINDINGS: CT HEAD FINDINGS Brain: There is no acute intracranial hemorrhage. No demarcated cortical infarct. No extra-axial fluid collection. No evidence of an intracranial mass. No midline shift. Vascular: No hyperdense vessel.  Atherosclerotic calcifications. Skull: No calvarial fracture or aggressive osseous lesion. CT MAXILLOFACIAL FINDINGS Osseous: Mildly displaced fracture of the lateral wall of the right bony orbit. Comminuted and displaced fractures of the anterior and posterolateral walls of the right maxillary sinus. Fracture of the right orbital floor with up to 2 mm of depression at this site. Nondisplaced fracture of the right inferior orbital rim. Displaced fracture of the lateral right pterygoid plate. Orbits: Right periorbital hematoma/laceration. Small foci of gas within the extraconal right orbit inferiorly, and within the right periorbital soft tissues. The globes are normal in size and contour. No appreciable retrobulbar hematoma. Sinuses: Hyperdense fluid level within the right maxillary sinus consistent with hemorrhage. Mild background mucosal thickening within the right maxillary sinus. Mild mucosal thickening within the left maxillary sinus. Mild bilateral ethmoid sinus disease. Mild mucosal thickening within the bilateral frontal sinuses. Soft tissues: Right periorbital hematoma/laceration. Foci of soft tissue gas surrounding the right maxillary sinus. CT CERVICAL SPINE FINDINGS Alignment: Nonspecific straightening of the expected cervical  lordosis. No significant spondylolisthesis. Skull base and vertebrae: The basion-dental and atlanto-dental intervals are maintained.No evidence of acute fracture to the cervical spine. Soft tissues and spinal canal: No prevertebral fluid or swelling. No visible canal hematoma. Disc levels: Cervical spondylosis with multilevel disc space narrowing, disc bulges/central disc protrusions, endplate spurring, uncovertebral hypertrophy and facet arthropathy. Disc space narrowing is greatest at C5-C6 (moderate at this level). No appreciable high-grade spinal canal stenosis. Multilevel bony neural foraminal narrowing. Multilevel ventral osteophytes (including a bridging ventral osteophyte at C7-T1).  Degenerative changes also present at the C1-C2 articulation. Upper chest: No consolidation within the imaged lung apices. No visible pneumothorax. IMPRESSION: CT head: No evidence of an acute intracranial abnormality. CT maxillofacial: 1. Mildly displaced fracture of the lateral wall of the right bony orbit. 2. Comminuted and displaced fractures of the anterior and posterolateral walls of the right maxillary sinus. 3. Fracture of the right orbital floor with up to 2 mm of fracture depression. 4. Nondisplaced fracture of the right inferior orbital rim. 5. Displaced fracture of the lateral right pterygoid plate. 6. Right periorbital hematoma/laceration. 7. Small foci of gas within the extraconal right orbit inferiorly. 8. Paranasal sinus disease as described (including a hemorrhage level within the right maxillary sinus). CT cervical spine: 1. No evidence of an acute cervical spine fracture. 2. Known straightening of the expected cervical lordosis. 3. Cervical spondylosis as described. Electronically Signed   By: Rockey Childs D.O.   On: 04/03/2024 12:32    CT Cervical Spine Wo Contrast Result Date: 04/03/2024 CLINICAL DATA:  Provided history: Head trauma, moderate/severe. Facial trauma, 1. Neck trauma. Additional history  provided: Right brow and nasal trauma. EXAM: CT HEAD WITHOUT CONTRAST CT MAXILLOFACIAL WITHOUT CONTRAST CT CERVICAL SPINE WITHOUT CONTRAST TECHNIQUE: Multidetector CT imaging of the head, cervical spine, and maxillofacial structures were performed using the standard protocol without intravenous contrast. Multiplanar CT image reconstructions of the cervical spine and maxillofacial structures were also generated. RADIATION DOSE REDUCTION: This exam was performed according to the departmental dose-optimization program which includes automated exposure control, adjustment of the mA and/or kV according to patient size and/or use of iterative reconstruction technique. COMPARISON:  None. FINDINGS: CT HEAD FINDINGS Brain: There is no acute intracranial hemorrhage. No demarcated cortical infarct. No extra-axial fluid collection. No evidence of an intracranial mass. No midline shift. Vascular: No hyperdense vessel.  Atherosclerotic calcifications. Skull: No calvarial fracture or aggressive osseous lesion. CT MAXILLOFACIAL FINDINGS Osseous: Mildly displaced fracture of the lateral wall of the right bony orbit. Comminuted and displaced fractures of the anterior and posterolateral walls of the right maxillary sinus. Fracture of the right orbital floor with up to 2 mm of depression at this site. Nondisplaced fracture of the right inferior orbital rim. Displaced fracture of the lateral right pterygoid plate. Orbits: Right periorbital hematoma/laceration. Small foci of gas within the extraconal right orbit inferiorly, and within the right periorbital soft tissues. The globes are normal in size and contour. No appreciable retrobulbar hematoma. Sinuses: Hyperdense fluid level within the right maxillary sinus consistent with hemorrhage. Mild background mucosal thickening within the right maxillary sinus. Mild mucosal thickening within the left maxillary sinus. Mild bilateral ethmoid sinus disease. Mild mucosal thickening within the  bilateral frontal sinuses. Soft tissues: Right periorbital hematoma/laceration. Foci of soft tissue gas surrounding the right maxillary sinus. CT CERVICAL SPINE FINDINGS Alignment: Nonspecific straightening of the expected cervical lordosis. No significant spondylolisthesis. Skull base and vertebrae: The basion-dental and atlanto-dental intervals are maintained.No evidence of acute fracture to the cervical spine. Soft tissues and spinal canal: No prevertebral fluid or swelling. No visible canal hematoma. Disc levels: Cervical spondylosis with multilevel disc space narrowing, disc bulges/central disc protrusions, endplate spurring, uncovertebral hypertrophy and facet arthropathy. Disc space narrowing is greatest at C5-C6 (moderate at this level). No appreciable high-grade spinal canal stenosis. Multilevel bony neural foraminal narrowing. Multilevel ventral osteophytes (including a bridging ventral osteophyte at C7-T1). Degenerative changes also present at the C1-C2 articulation. Upper chest: No consolidation within the imaged lung apices. No visible pneumothorax. IMPRESSION:  CT head: No evidence of an acute intracranial abnormality. CT maxillofacial: 1. Mildly displaced fracture of the lateral wall of the right bony orbit. 2. Comminuted and displaced fractures of the anterior and posterolateral walls of the right maxillary sinus. 3. Fracture of the right orbital floor with up to 2 mm of fracture depression. 4. Nondisplaced fracture of the right inferior orbital rim. 5. Displaced fracture of the lateral right pterygoid plate. 6. Right periorbital hematoma/laceration. 7. Small foci of gas within the extraconal right orbit inferiorly. 8. Paranasal sinus disease as described (including a hemorrhage level within the right maxillary sinus). CT cervical spine: 1. No evidence of an acute cervical spine fracture. 2. Known straightening of the expected cervical lordosis. 3. Cervical spondylosis as described. Electronically  Signed   By: Rockey Childs D.O.   On: 04/03/2024 12:32    CT Maxillofacial Wo Contrast Result Date: 04/03/2024 CLINICAL DATA:  Provided history: Head trauma, moderate/severe. Facial trauma, 1. Neck trauma. Additional history provided: Right brow and nasal trauma. EXAM: CT HEAD WITHOUT CONTRAST CT MAXILLOFACIAL WITHOUT CONTRAST CT CERVICAL SPINE WITHOUT CONTRAST TECHNIQUE: Multidetector CT imaging of the head, cervical spine, and maxillofacial structures were performed using the standard protocol without intravenous contrast. Multiplanar CT image reconstructions of the cervical spine and maxillofacial structures were also generated. RADIATION DOSE REDUCTION: This exam was performed according to the departmental dose-optimization program which includes automated exposure control, adjustment of the mA and/or kV according to patient size and/or use of iterative reconstruction technique. COMPARISON:  None. FINDINGS: CT HEAD FINDINGS Brain: There is no acute intracranial hemorrhage. No demarcated cortical infarct. No extra-axial fluid collection. No evidence of an intracranial mass. No midline shift. Vascular: No hyperdense vessel.  Atherosclerotic calcifications. Skull: No calvarial fracture or aggressive osseous lesion. CT MAXILLOFACIAL FINDINGS Osseous: Mildly displaced fracture of the lateral wall of the right bony orbit. Comminuted and displaced fractures of the anterior and posterolateral walls of the right maxillary sinus. Fracture of the right orbital floor with up to 2 mm of depression at this site. Nondisplaced fracture of the right inferior orbital rim. Displaced fracture of the lateral right pterygoid plate. Orbits: Right periorbital hematoma/laceration. Small foci of gas within the extraconal right orbit inferiorly, and within the right periorbital soft tissues. The globes are normal in size and contour. No appreciable retrobulbar hematoma. Sinuses: Hyperdense fluid level within the right maxillary sinus  consistent with hemorrhage. Mild background mucosal thickening within the right maxillary sinus. Mild mucosal thickening within the left maxillary sinus. Mild bilateral ethmoid sinus disease. Mild mucosal thickening within the bilateral frontal sinuses. Soft tissues: Right periorbital hematoma/laceration. Foci of soft tissue gas surrounding the right maxillary sinus. CT CERVICAL SPINE FINDINGS Alignment: Nonspecific straightening of the expected cervical lordosis. No significant spondylolisthesis. Skull base and vertebrae: The basion-dental and atlanto-dental intervals are maintained.No evidence of acute fracture to the cervical spine. Soft tissues and spinal canal: No prevertebral fluid or swelling. No visible canal hematoma. Disc levels: Cervical spondylosis with multilevel disc space narrowing, disc bulges/central disc protrusions, endplate spurring, uncovertebral hypertrophy and facet arthropathy. Disc space narrowing is greatest at C5-C6 (moderate at this level). No appreciable high-grade spinal canal stenosis. Multilevel bony neural foraminal narrowing. Multilevel ventral osteophytes (including a bridging ventral osteophyte at C7-T1). Degenerative changes also present at the C1-C2 articulation. Upper chest: No consolidation within the imaged lung apices. No visible pneumothorax. IMPRESSION: CT head: No evidence of an acute intracranial abnormality. CT maxillofacial: 1. Mildly displaced fracture of the lateral wall of the right  bony orbit. 2. Comminuted and displaced fractures of the anterior and posterolateral walls of the right maxillary sinus. 3. Fracture of the right orbital floor with up to 2 mm of fracture depression. 4. Nondisplaced fracture of the right inferior orbital rim. 5. Displaced fracture of the lateral right pterygoid plate. 6. Right periorbital hematoma/laceration. 7. Small foci of gas within the extraconal right orbit inferiorly. 8. Paranasal sinus disease as described (including a hemorrhage  level within the right maxillary sinus). CT cervical spine: 1. No evidence of an acute cervical spine fracture. 2. Known straightening of the expected cervical lordosis. 3. Cervical spondylosis as described. Electronically Signed   By: Rockey Childs D.O.   On: 04/03/2024 12:32         Procedures    Medications Ordered in the ED  oxyCODONE -acetaminophen  (PERCOCET/ROXICET) 5-325 MG per tablet 1 tablet (1 tablet Oral Given 04/03/24 1125)  lidocaine (PF) (XYLOCAINE) 1 % injection 5 mL (5 mLs Intradermal Given 04/03/24 1316)         Clinical Course as of 04/03/24 1434  Mon Apr 03, 2024  1326 +facial fx. ENT. + rib fxs [MP]  1330 Labs: Labs were reviewed CT imaging.  No intracranial findings.  Multiple facial fractures including displaced lateral wall of the right orbit displaced fractures of anterior and posterior lateral walls of the right maxillary sinus, fracture of the right orbital floor, nondisplaced fracture of the right inferior orbital rim, displaced fracture of the lateral right pterygoid plate.  No evidence of cervical spine injury.   Minimally displaced right 7th and 8th rib fractures anterolaterally   Left hand and wrist are okay without fracture    [MP]  1331 Discussed the facial fractures with Dr. Tobie, (ENT/facial trauma on-call).  No need for operative management at this time.  Will discharge with sinus precautions including soft diet until cleared by ENT in 2 weeks.  Antibiotics not indicated at this time. [MP]  1429 Right brow laceration repaired by PA Bundy.  8 Prolene stitches placed.       04/11/24 Patient is doing remarkably well considering the trauma he suffered.  He is working every day.  He admits to taking 800 mg of ibuprofen every 6 hours.  I asked him to reduce the amount of ibuprofen to avoid gastritis.  He can supplement with Tylenol .  He declines any narcotics.  He is here today for suture removal.  The laceration above his right eyebrow feels well-healed.  There  is no evidence of secondary cellulitis.  He does have some numbness in the upper part of his right lip which I suspect is due to swelling and trauma around the second division of the trigeminal nerve Red circles represent Past Medical History:  Diagnosis Date   Arthritis    Diabetes mellitus type II, uncontrolled    Diverticulosis    Hyperlipidemia    Hypertension    No past surgical history on file. Current Outpatient Medications on File Prior to Visit  Medication Sig Dispense Refill   aspirin EC 81 MG tablet Take 81 mg by mouth daily.     azelastine  (ASTELIN ) 0.1 % nasal spray USE TWO SPRAYS IN EACH NOSTRIL ONCE A DAY 30 mL 3   celecoxib  (CELEBREX ) 200 MG capsule Take 1 capsule (200 mg total) by mouth 2 (two) times daily. 60 capsule 5   dapagliflozin  propanediol (FARXIGA ) 10 MG TABS tablet TAKE 1 TABLET BY MOUTH EVERY DAY BEFORE BREAKFAST 90 tablet 3   doxazosin  (CARDURA ) 4 MG  tablet Take 1 tablet (4 mg total) by mouth daily. 90 tablet 3   fish oil-omega-3 fatty acids  1000 MG capsule Take 1-2 capsules (1-2 g total) by mouth 2 (two) times daily. 2 capsules in morning, and 1 in evening, daily 90 capsule 3   fluticasone  (FLONASE ) 50 MCG/ACT nasal spray Place 2 sprays into both nostrils daily. 48 mL 2   Glucosamine-Chondroitin (GLUCOSAMINE CHONDR COMPLEX PO) Take 1 tablet by mouth 2 (two) times daily.     glucose blood test strip Use to check blood sugar twice daily.*Dispense Contour next test strips* 100 each 12   HYDROcodone  bit-homatropine (HYCODAN) 5-1.5 MG/5ML syrup Take 5 mLs by mouth every 8 (eight) hours as needed for cough. 120 mL 0   loratadine  (CLARITIN ) 10 MG tablet Take 1 tablet (10 mg total) by mouth daily. 30 tablet 11   meclizine  (ANTIVERT ) 12.5 MG tablet Take 1 tablet (12.5 mg total) by mouth every 8 (eight) hours as needed for dizziness. 30 tablet 0   metFORMIN  (GLUCOPHAGE -XR) 500 MG 24 hr tablet Take 4 tablets (2,000 mg total) by mouth daily with breakfast. 360 tablet 1    Multiple Vitamin (MULTIVITAMIN) tablet Take 1 tablet by mouth daily.     promethazine -dextromethorphan (PROMETHAZINE -DM) 6.25-15 MG/5ML syrup Take 5 mLs by mouth 4 (four) times daily as needed for cough. 118 mL 0   rosuvastatin  (CRESTOR ) 10 MG tablet Take 1 tablet (10 mg total) by mouth daily. 90 tablet 3   sildenafil  (VIAGRA ) 100 MG tablet Take 0.5-1 tablets (50-100 mg total) by mouth daily as needed for erectile dysfunction. 4 tablet 14   tirzepatide  (MOUNJARO ) 12.5 MG/0.5ML Pen Inject 12.5 mg into the skin once a week. 2 mL 3   valsartan -hydrochlorothiazide  (DIOVAN -HCT) 320-25 MG tablet Take 1 tablet by mouth daily. 90 tablet 3   No current facility-administered medications on file prior to visit.   Allergies  Allergen Reactions   Lipitor [Atorvastatin] Other (See Comments)    Muscle cramps, cant walk, sluggish   Sulfa Antibiotics Itching and Rash   Social History   Socioeconomic History   Marital status: Married    Spouse name: Not on file   Number of children: Not on file   Years of education: Not on file   Highest education level: Not on file  Occupational History   Not on file  Tobacco Use   Smoking status: Never   Smokeless tobacco: Current    Types: Chew  Vaping Use   Vaping status: Never Used  Substance and Sexual Activity   Alcohol use: No   Drug use: No   Sexual activity: Not on file  Other Topics Concern   Not on file  Social History Narrative   Not on file   Social Drivers of Health   Financial Resource Strain: Low Risk  (03/02/2024)   Overall Financial Resource Strain (CARDIA)    Difficulty of Paying Living Expenses: Not very hard  Food Insecurity: No Food Insecurity (03/02/2024)   Hunger Vital Sign    Worried About Running Out of Food in the Last Year: Never true    Ran Out of Food in the Last Year: Never true  Transportation Needs: No Transportation Needs (02/25/2023)   PRAPARE - Administrator, Civil Service (Medical): No    Lack of  Transportation (Non-Medical): No  Physical Activity: Sufficiently Active (03/02/2024)   Exercise Vital Sign    Days of Exercise per Week: 5 days    Minutes of Exercise per Session:  50 min  Stress: No Stress Concern Present (03/02/2024)   [employer] of Occupational Health - Occupational Stress Questionnaire    Feeling of Stress: Not at all  Social Connections: Moderately Integrated (03/02/2024)   Social Connection and Isolation Panel    Frequency of Communication with Friends and Family: More than three times a week    Frequency of Social Gatherings with Friends and Family: Three times a week    Attends Religious Services: More than 4 times per year    Active Member of Clubs or Organizations: No    Attends Banker Meetings: Never    Marital Status: Married  Catering manager Violence: Not At Risk (03/02/2024)   Humiliation, Afraid, Rape, and Kick questionnaire    Fear of Current or Ex-Partner: No    Emotionally Abused: No    Physically Abused: No    Sexually Abused: No     Review of Systems  All other systems reviewed and are negative.      Objective:   Physical Exam Vitals reviewed.  Constitutional:      General: He is not in acute distress.    Appearance: Normal appearance. He is normal weight. He is not ill-appearing.  HENT:     Head: Laceration present.     Jaw: Tenderness, swelling and pain on movement present.   Cardiovascular:     Rate and Rhythm: Normal rate and regular rhythm.     Pulses: Normal pulses.     Heart sounds: Normal heart sounds. No murmur heard.    No friction rub. No gallop.  Pulmonary:     Effort: Pulmonary effort is normal. No respiratory distress.     Breath sounds: Normal breath sounds. No wheezing, rhonchi or rales.  Musculoskeletal:     Right lower leg: No edema.     Left lower leg: No edema.  Neurological:     Mental Status: He is alert.           Assessment & Plan:  Visit for suture removal 8 sutures removed  without difficulty.  Wound is well-healed without secondary infection.  Still significant swelling in his right maxilla and right jaw and around right orbit with some bruising on the inferior portion of his right orbit.  Patient is talking without difficulty.  He is eating soft foods without difficulty.  He admits to eating hamburger.  He has an appointment next week to see ENT.

## 2024-04-18 ENCOUNTER — Encounter (INDEPENDENT_AMBULATORY_CARE_PROVIDER_SITE_OTHER): Payer: Self-pay | Admitting: Otolaryngology

## 2024-04-18 ENCOUNTER — Ambulatory Visit (INDEPENDENT_AMBULATORY_CARE_PROVIDER_SITE_OTHER): Admitting: Otolaryngology

## 2024-04-18 VITALS — BP 123/70 | HR 100 | Ht 68.0 in | Wt 230.0 lb

## 2024-04-18 DIAGNOSIS — S0181XD Laceration without foreign body of other part of head, subsequent encounter: Secondary | ICD-10-CM

## 2024-04-18 DIAGNOSIS — S0231XD Fracture of orbital floor, right side, subsequent encounter for fracture with routine healing: Secondary | ICD-10-CM

## 2024-04-18 DIAGNOSIS — S02401A Maxillary fracture, unspecified, initial encounter for closed fracture: Secondary | ICD-10-CM

## 2024-04-18 DIAGNOSIS — S0230XD Fracture of orbital floor, unspecified side, subsequent encounter for fracture with routine healing: Secondary | ICD-10-CM

## 2024-04-18 NOTE — Patient Instructions (Signed)
 Have eye doctor check pressure in the eye and do an eye exam Increase flonase  and astelin  to twice daily for 2 weeks.

## 2024-04-18 NOTE — Progress Notes (Signed)
 Dear Dr. [name], Here is my assessment for our mutual patient, James Ponce. Thank you for allowing me the opportunity to care for your patient. Please do not hesitate to contact me should you have any other questions. Sincerely, Dr. Eldora Blanch  Otolaryngology Clinic Note Referring provider: Dr. [name] HPI:  James Ponce is a 68 y.o. male kindly referred by Dr. [name] for evaluation of facial trauma and lacerations  Initial visit (04/2024): Patient reports: fell off and hit his head, possible LOC. He had fair amount of facial bruising and swelling as well as brow laceration which was repaired in ED. PCP took sutures out already. Currently, he is doing fairly well. Denies diplopia. Does have some right facial/V2 numbness. Some nasal congestion. He actually reports his right eye is seeing better than the left now. Patient additionally denies:  - other lacerations, malocclusion, teeth instability, trismus - enophthalmos, hypoglobus, vision loss - significant facial deformity - trouble chewing or swallowing - epistaxis, hearing loss after trauma, nasal obstruction - otorrhea  Personal or FHx of bleeding dz or anesthesia difficulty: no  AP/AC: ASA  PMHx: HTN, HLD, T2DM  Independent Review of Additional Tests or Records:  Dr. Pamella (04/03/2024): fall resulting in facial trauma; right brow; Laceration repaired in ED. Ref to ENT CT Face 9/292/2025 independently interpreted with respect to facial fractures and agree with read - right max sinus A/P fracture, right orbital floor fracture, right pterygoid fracture, right lateral orbital wall fracture  PMH/Meds/All/SocHx/FamHx/ROS:   Past Medical History:  Diagnosis Date   Arthritis    Diabetes mellitus type II, uncontrolled    Diverticulosis    Hyperlipidemia    Hypertension      History reviewed. No pertinent surgical history.  History reviewed. No pertinent family history.   Social Connections: Moderately Integrated  (03/02/2024)   Social Connection and Isolation Panel    Frequency of Communication with Friends and Family: More than three times a week    Frequency of Social Gatherings with Friends and Family: Three times a week    Attends Religious Services: More than 4 times per year    Active Member of Clubs or Organizations: No    Attends Banker Meetings: Never    Marital Status: Married      Current Outpatient Medications:    aspirin EC 81 MG tablet, Take 81 mg by mouth daily., Disp: , Rfl:    azelastine  (ASTELIN ) 0.1 % nasal spray, USE TWO SPRAYS IN EACH NOSTRIL ONCE A DAY, Disp: 30 mL, Rfl: 3   celecoxib  (CELEBREX ) 200 MG capsule, Take 1 capsule (200 mg total) by mouth 2 (two) times daily., Disp: 60 capsule, Rfl: 5   dapagliflozin  propanediol (FARXIGA ) 10 MG TABS tablet, TAKE 1 TABLET BY MOUTH EVERY DAY BEFORE BREAKFAST, Disp: 90 tablet, Rfl: 3   doxazosin  (CARDURA ) 4 MG tablet, Take 1 tablet (4 mg total) by mouth daily., Disp: 90 tablet, Rfl: 3   fish oil-omega-3 fatty acids  1000 MG capsule, Take 1-2 capsules (1-2 g total) by mouth 2 (two) times daily. 2 capsules in morning, and 1 in evening, daily, Disp: 90 capsule, Rfl: 3   fluticasone  (FLONASE ) 50 MCG/ACT nasal spray, Place 2 sprays into both nostrils daily., Disp: 48 mL, Rfl: 2   Glucosamine-Chondroitin (GLUCOSAMINE CHONDR COMPLEX PO), Take 1 tablet by mouth 2 (two) times daily., Disp: , Rfl:    glucose blood test strip, Use to check blood sugar twice daily.*Dispense Contour next test strips*, Disp: 100 each, Rfl: 12  loratadine  (CLARITIN ) 10 MG tablet, Take 1 tablet (10 mg total) by mouth daily., Disp: 30 tablet, Rfl: 11   meclizine  (ANTIVERT ) 12.5 MG tablet, Take 1 tablet (12.5 mg total) by mouth every 8 (eight) hours as needed for dizziness., Disp: 30 tablet, Rfl: 0   metFORMIN  (GLUCOPHAGE -XR) 500 MG 24 hr tablet, Take 4 tablets (2,000 mg total) by mouth daily with breakfast., Disp: 360 tablet, Rfl: 1   Multiple Vitamin  (MULTIVITAMIN) tablet, Take 1 tablet by mouth daily., Disp: , Rfl:    rosuvastatin  (CRESTOR ) 10 MG tablet, Take 1 tablet (10 mg total) by mouth daily., Disp: 90 tablet, Rfl: 3   sildenafil  (VIAGRA ) 100 MG tablet, Take 0.5-1 tablets (50-100 mg total) by mouth daily as needed for erectile dysfunction., Disp: 4 tablet, Rfl: 14   tirzepatide  (MOUNJARO ) 12.5 MG/0.5ML Pen, Inject 12.5 mg into the skin once a week., Disp: 2 mL, Rfl: 3   valsartan -hydrochlorothiazide  (DIOVAN -HCT) 320-25 MG tablet, Take 1 tablet by mouth daily., Disp: 90 tablet, Rfl: 3   Physical Exam:   BP 123/70 (BP Location: Left Arm, Patient Position: Sitting, Cuff Size: Large)   Pulse 100   Ht 5' 8 (1.727 m)   Wt 230 lb (104.3 kg)   SpO2 96%   BMI 34.97 kg/m   Salient findings:  CN II-XII intact - except right V2 numbness  Bilateral EAC clear and TM intact with well pneumatized middle ear spaces Anterior rhinoscopy: Septum intact; bilateral inferior turbinates without significant hypertrophy; modest dryness No lesions of oral cavity/oropharynx; dentition fair No mandible or midface or orbital rim stepoffs appreciated Right brow laceration well healed. Right periorbital ecchymosis resolving No obviously palpable neck masses/lymphadenopathy/thyromegaly No respiratory distress or stridor  Seprately Identifiable Procedures:  Prior to initiating any procedures, risks/benefits/alternatives were explained to the patient and verbal consent obtained. None  Impression & Plans:  James Ponce is a 68 y.o. male with:  1. Facial laceration, subsequent encounter   2. Closed fracture of orbital floor with routine healing, unspecified laterality, subsequent encounter   3. Closed fracture of maxillary sinus, unspecified laterality, initial encounter (HCC)    Noted laceration healing well. From facial fracture standpoint, expect non-operative management. He has an optho doc who he will follow up. We discussed return precautions from  eye standpoint, and will call us  if he develops those. Increase flonase  and astelin  increase to twice per day for about 2 weeks.   F/u PRN   Thank you for allowing me the opportunity to care for your patient. Please do not hesitate to contact me should you have any other questions.  Sincerely, Eldora Blanch, MD Otolaryngologist (ENT), [facility] Phone: [phone] Fax: [phone]  04/23/2024, 2:54 PM   MDM:  Level 4 - [phone] Complexity/Problems addressed: low Data complexity: mod - independent interpretation of CT imaging - Morbidity: mod  - Drug prescribed or managed: y

## 2024-04-20 DIAGNOSIS — S0285XA Fracture of orbit, unspecified, initial encounter for closed fracture: Secondary | ICD-10-CM | POA: Diagnosis not present

## 2024-04-20 DIAGNOSIS — E119 Type 2 diabetes mellitus without complications: Secondary | ICD-10-CM | POA: Diagnosis not present

## 2024-04-20 DIAGNOSIS — H2513 Age-related nuclear cataract, bilateral: Secondary | ICD-10-CM | POA: Diagnosis not present

## 2024-04-20 DIAGNOSIS — H353131 Nonexudative age-related macular degeneration, bilateral, early dry stage: Secondary | ICD-10-CM | POA: Diagnosis not present

## 2024-05-06 DIAGNOSIS — E119 Type 2 diabetes mellitus without complications: Secondary | ICD-10-CM | POA: Diagnosis not present

## 2024-06-06 ENCOUNTER — Other Ambulatory Visit: Payer: Self-pay | Admitting: Family Medicine

## 2024-06-06 DIAGNOSIS — E7800 Pure hypercholesterolemia, unspecified: Secondary | ICD-10-CM

## 2024-06-06 DIAGNOSIS — E118 Type 2 diabetes mellitus with unspecified complications: Secondary | ICD-10-CM

## 2024-06-07 DIAGNOSIS — Z85828 Personal history of other malignant neoplasm of skin: Secondary | ICD-10-CM | POA: Diagnosis not present

## 2024-06-07 DIAGNOSIS — L814 Other melanin hyperpigmentation: Secondary | ICD-10-CM | POA: Diagnosis not present

## 2024-06-07 DIAGNOSIS — Z08 Encounter for follow-up examination after completed treatment for malignant neoplasm: Secondary | ICD-10-CM | POA: Diagnosis not present

## 2024-06-07 DIAGNOSIS — L570 Actinic keratosis: Secondary | ICD-10-CM | POA: Diagnosis not present

## 2024-06-07 DIAGNOSIS — L400 Psoriasis vulgaris: Secondary | ICD-10-CM | POA: Diagnosis not present

## 2024-06-08 ENCOUNTER — Other Ambulatory Visit

## 2024-06-08 DIAGNOSIS — E118 Type 2 diabetes mellitus with unspecified complications: Secondary | ICD-10-CM

## 2024-06-08 DIAGNOSIS — Z125 Encounter for screening for malignant neoplasm of prostate: Secondary | ICD-10-CM

## 2024-06-08 DIAGNOSIS — Z0000 Encounter for general adult medical examination without abnormal findings: Secondary | ICD-10-CM

## 2024-06-08 DIAGNOSIS — H8113 Benign paroxysmal vertigo, bilateral: Secondary | ICD-10-CM

## 2024-06-08 DIAGNOSIS — I10 Essential (primary) hypertension: Secondary | ICD-10-CM | POA: Diagnosis not present

## 2024-06-08 DIAGNOSIS — E7800 Pure hypercholesterolemia, unspecified: Secondary | ICD-10-CM

## 2024-06-09 ENCOUNTER — Ambulatory Visit: Payer: Self-pay | Admitting: Family Medicine

## 2024-06-10 LAB — HEMOGLOBIN A1C
Hgb A1c MFr Bld: 5.7 % — ABNORMAL HIGH (ref <5.7)
Mean Plasma Glucose: 117 mg/dL
eAG (mmol/L): 6.5 mmol/L

## 2024-06-10 LAB — COMPLETE METABOLIC PANEL WITHOUT GFR
AG Ratio: 1.7 (calc) (ref 1.0–2.5)
ALT: 25 U/L (ref 9–46)
AST: 23 U/L (ref 10–35)
Albumin: 4.7 g/dL (ref 3.6–5.1)
Alkaline phosphatase (APISO): 74 U/L (ref 35–144)
BUN: 17 mg/dL (ref 7–25)
CO2: 25 mmol/L (ref 20–32)
Calcium: 10.2 mg/dL (ref 8.6–10.3)
Chloride: 100 mmol/L (ref 98–110)
Creat: 0.9 mg/dL (ref 0.70–1.35)
Globulin: 2.7 g/dL (ref 1.9–3.7)
Glucose, Bld: 117 mg/dL — ABNORMAL HIGH (ref 65–99)
Potassium: 4.1 mmol/L (ref 3.5–5.3)
Sodium: 138 mmol/L (ref 135–146)
Total Bilirubin: 0.6 mg/dL (ref 0.2–1.2)
Total Protein: 7.4 g/dL (ref 6.1–8.1)

## 2024-06-10 LAB — CBC WITH DIFFERENTIAL/PLATELET
Absolute Lymphocytes: 1231 {cells}/uL (ref 850–3900)
Absolute Monocytes: 513 {cells}/uL (ref 200–950)
Basophils Absolute: 51 {cells}/uL (ref 0–200)
Basophils Relative: 0.9 %
Eosinophils Absolute: 262 {cells}/uL (ref 15–500)
Eosinophils Relative: 4.6 %
HCT: 50.9 % (ref 39.4–51.1)
Hemoglobin: 17.2 g/dL — ABNORMAL HIGH (ref 13.2–17.1)
MCH: 30.6 pg (ref 27.0–33.0)
MCHC: 33.8 g/dL (ref 31.6–35.4)
MCV: 90.4 fL (ref 81.4–101.7)
MPV: 9.5 fL (ref 7.5–12.5)
Monocytes Relative: 9 %
Neutro Abs: 3642 {cells}/uL (ref 1500–7800)
Neutrophils Relative %: 63.9 %
Platelets: 200 Thousand/uL (ref 140–400)
RBC: 5.63 Million/uL (ref 4.20–5.80)
RDW: 12.7 % (ref 11.0–15.0)
Total Lymphocyte: 21.6 %
WBC: 5.7 Thousand/uL (ref 3.8–10.8)

## 2024-06-10 LAB — LIPID PANEL
Cholesterol: 118 mg/dL (ref <200)
HDL: 47 mg/dL (ref >=40)
LDL Cholesterol (Calc): 52 mg/dL
Non-HDL Cholesterol (Calc): 71 mg/dL (ref <130)
Total CHOL/HDL Ratio: 2.5 (calc) (ref <5.0)
Triglycerides: 104 mg/dL (ref <150)

## 2024-06-10 LAB — VITAMIN B12: Vitamin B-12: 819 pg/mL (ref 200–1100)

## 2024-06-10 LAB — MICROALBUMIN / CREATININE URINE RATIO
Creatinine, Urine: 24 mg/dL (ref 20–320)
Microalb, Ur: <0.2 mg/dL

## 2024-06-16 ENCOUNTER — Ambulatory Visit: Admitting: Family Medicine

## 2024-06-16 ENCOUNTER — Encounter: Payer: Self-pay | Admitting: Family Medicine

## 2024-06-16 VITALS — BP 136/76 | HR 58 | Temp 97.6°F | Ht 68.0 in | Wt 243.0 lb

## 2024-06-16 DIAGNOSIS — I10 Essential (primary) hypertension: Secondary | ICD-10-CM

## 2024-06-16 DIAGNOSIS — I152 Hypertension secondary to endocrine disorders: Secondary | ICD-10-CM | POA: Diagnosis not present

## 2024-06-16 DIAGNOSIS — Z7984 Long term (current) use of oral hypoglycemic drugs: Secondary | ICD-10-CM | POA: Diagnosis not present

## 2024-06-16 DIAGNOSIS — E1159 Type 2 diabetes mellitus with other circulatory complications: Secondary | ICD-10-CM | POA: Diagnosis not present

## 2024-06-16 MED ORDER — TIRZEPATIDE 15 MG/0.5ML ~~LOC~~ SOAJ: 15.0000 mg | SUBCUTANEOUS | 3 refills | Status: AC

## 2024-06-16 NOTE — Progress Notes (Signed)
 Subjective:    Patient ID: James Ponce, male    DOB: 1956/06/06, 68 y.o.   MRN: [id number] Patient is here today for a checkup.  PSA was 0.51 in 6/25.   Wt Readings from Last 3 Encounters:  06/16/24 243 lb (110.2 kg)  04/18/24 230 lb (104.3 kg)  04/11/24 241 lb (109.3 kg)   Patient's blood pressure today is acceptable at 136/76.  He denies any chest pain or shortness of breath or dyspnea on exertion.  His most recent lab work is listed below.  Labs are significant for an excellent hemoglobin A1c but an elevated hemoglobin.  His wife is concerned that he may have sleep apnea. Lab on 06/08/2024  Component Date Value Ref Range Status   WBC 06/08/2024 5.7  3.8 - 10.8 Thousand/uL Final   RBC 06/08/2024 5.63  4.20 - 5.80 Million/uL Final   Hemoglobin 06/08/2024 17.2 (H)  13.2 - 17.1 g/dL Final   HCT 87/95/7974 50.9  39.4 - 51.1 % Final   MCV 06/08/2024 90.4  81.4 - 101.7 fL Final   MCH 06/08/2024 30.6  27.0 - 33.0 pg Final   MCHC 06/08/2024 33.8  31.6 - 35.4 g/dL Final   Comment: For adults, a slight decrease in the calculated MCHC value (in the range of 30 to 32 g/dL) is most likely not clinically significant; however, it should be interpreted with caution in correlation with other red cell parameters and the patient's clinical condition.    RDW 06/08/2024 12.7  11.0 - 15.0 % Final   Platelets 06/08/2024 200  140 - 400 Thousand/uL Final   MPV 06/08/2024 9.5  7.5 - 12.5 fL Final   Neutro Abs 06/08/2024 3,642  1,500 - 7,800 cells/uL Final   Absolute Lymphocytes 06/08/2024 1,231  850 - 3,900 cells/uL Final   Absolute Monocytes 06/08/2024 513  200 - 950 cells/uL Final   Eosinophils Absolute 06/08/2024 262  15 - 500 cells/uL Final   Basophils Absolute 06/08/2024 51  0 - 200 cells/uL Final   Neutrophils Relative % 06/08/2024 63.9  % Final   Total Lymphocyte 06/08/2024 21.6  % Final   Monocytes Relative 06/08/2024 9.0  % Final   Eosinophils Relative 06/08/2024 4.6  % Final   Basophils  Relative 06/08/2024 0.9  % Final   Glucose, Bld 06/08/2024 117 (H)  65 - 99 mg/dL Final   Comment: .            Fasting reference interval . For someone without known diabetes, a glucose value between 100 and 125 mg/dL is consistent with prediabetes and should be confirmed with a follow-up test. .    BUN 06/08/2024 17  7 - 25 mg/dL Final   Creat 87/95/7974 0.90  0.70 - 1.35 mg/dL Final   BUN/Creatinine Ratio 06/08/2024 SEE NOTE:  6 - 22 (calc) Final   Comment:    Not Reported: BUN and Creatinine are within    reference range. .    Sodium 06/08/2024 138  135 - 146 mmol/L Final   Potassium 06/08/2024 4.1  3.5 - 5.3 mmol/L Final   Chloride 06/08/2024 100  98 - 110 mmol/L Final   CO2 06/08/2024 25  20 - 32 mmol/L Final   Calcium  06/08/2024 10.2  8.6 - 10.3 mg/dL Final   Total Protein 87/95/7974 7.4  6.1 - 8.1 g/dL Final   Albumin 87/95/7974 4.7  3.6 - 5.1 g/dL Final   Globulin 87/95/7974 2.7  1.9 - 3.7 g/dL (calc) Final   AG Ratio  06/08/2024 1.7  1.0 - 2.5 (calc) Final   Total Bilirubin 06/08/2024 0.6  0.2 - 1.2 mg/dL Final   Alkaline phosphatase (APISO) 06/08/2024 74  35 - 144 U/L Final   AST 06/08/2024 23  10 - 35 U/L Final   ALT 06/08/2024 25  9 - 46 U/L Final   Cholesterol 06/08/2024 118  <200 mg/dL Final   HDL 87/95/7974 47  > OR = 40 mg/dL Final   Triglycerides 87/95/7974 104  <150 mg/dL Final   LDL Cholesterol (Calc) 06/08/2024 52  mg/dL (calc) Final   Comment: Reference range: <100 . Desirable range <100 mg/dL for primary prevention;   <70 mg/dL for patients with CHD or diabetic patients  with > or = 2 CHD risk factors. SABRA LDL-C is now calculated using the Martin-Hopkins  calculation, which is a validated novel method providing  better accuracy than the Friedewald equation in the  estimation of LDL-C.  Gladis APPLETHWAITE et al. SANDREA. 7986;689(80): 2061-2068  (http://education.[facility].com/faq/FAQ164)    Total CHOL/HDL Ratio 06/08/2024 2.5  <4.9 (calc) Final    Non-HDL Cholesterol (Calc) 06/08/2024 71  <130 mg/dL (calc) Final   Comment: For patients with diabetes plus 1 major ASCVD risk  factor, treating to a non-HDL-C goal of <100 mg/dL  (LDL-C of <29 mg/dL) is considered a therapeutic  option.    Hgb A1c MFr Bld 06/08/2024 5.7 (H)  <5.7 % Final   Comment: For someone without known diabetes, a hemoglobin  A1c value between 5.7% and 6.4% is consistent with prediabetes and should be confirmed with a  follow-up test. . For someone with known diabetes, a value <7% indicates that their diabetes is well controlled. A1c targets should be individualized based on duration of diabetes, age, comorbid conditions, and other considerations. . This assay result is consistent with an increased risk of diabetes. . Currently, no consensus exists regarding use of hemoglobin A1c for diagnosis of diabetes for children. .    Mean Plasma Glucose 06/08/2024 117  mg/dL Final   eAG (mmol/L) 87/95/7974 6.5  mmol/L Final   Creatinine, Urine 06/08/2024 24  20 - 320 mg/dL Final   Microalb, Ur 87/95/7974 <0.2  mg/dL Final   Comment: Reference Range Not established    Microalb Creat Ratio 06/08/2024 NOTE  <30 mg/g creat Final   Comment: NOTE: The urine albumin value is less than  0.2 mg/dL therefore we are unable to calculate  excretion and/or creatinine ratio. . The ADA defines abnormalities in albumin excretion as follows: SABRA Albuminuria Category        Result (mg/g creatinine) . Normal to Mildly increased   <30 Moderately increased         30-299  Severely increased           > OR = 300 . The ADA recommends that at least two of three specimens collected within a 3-6 month period be abnormal before considering a patient to be within a diagnostic category.    Vitamin B-12 06/08/2024 819  200 - 1,100 pg/mL Final    Past Medical History:  Diagnosis Date   Arthritis    Diabetes mellitus type II    Diverticulosis    Hyperlipidemia    Hypertension      No past surgical history on file. Current Outpatient Medications on File Prior to Visit  Medication Sig Dispense Refill   aspirin EC 81 MG tablet Take 81 mg by mouth daily.     azelastine  (ASTELIN ) 0.1 % nasal spray USE TWO  SPRAYS IN EACH NOSTRIL ONCE A DAY 30 mL 3   celecoxib  (CELEBREX ) 200 MG capsule Take 1 capsule (200 mg total) by mouth 2 (two) times daily. 60 capsule 5   dapagliflozin  propanediol (FARXIGA ) 10 MG TABS tablet TAKE 1 TABLET BY MOUTH EVERY DAY BEFORE BREAKFAST 90 tablet 3   doxazosin  (CARDURA ) 4 MG tablet Take 1 tablet (4 mg total) by mouth daily. 90 tablet 3   fish oil-omega-3 fatty acids  1000 MG capsule Take 1-2 capsules (1-2 g total) by mouth 2 (two) times daily. 2 capsules in morning, and 1 in evening, daily 90 capsule 3   fluticasone  (FLONASE ) 50 MCG/ACT nasal spray Place 2 sprays into both nostrils daily. 48 mL 2   Glucosamine-Chondroitin (GLUCOSAMINE CHONDR COMPLEX PO) Take 1 tablet by mouth 2 (two) times daily.     glucose blood test strip Use to check blood sugar twice daily.*Dispense Contour next test strips* 100 each 12   loratadine  (CLARITIN ) 10 MG tablet Take 1 tablet (10 mg total) by mouth daily. 30 tablet 11   meclizine  (ANTIVERT ) 12.5 MG tablet Take 1 tablet (12.5 mg total) by mouth every 8 (eight) hours as needed for dizziness. 30 tablet 0   metFORMIN  (GLUCOPHAGE -XR) 500 MG 24 hr tablet Take 4 tablets (2,000 mg total) by mouth daily with breakfast. 360 tablet 1   MOUNJARO  12.5 MG/0.5ML Pen Inject 12.5 mg into the skin once a week. 2 mL 3   Multiple Vitamin (MULTIVITAMIN) tablet Take 1 tablet by mouth daily.     rosuvastatin  (CRESTOR ) 10 MG tablet Take 1 tablet (10 mg total) by mouth daily. 90 tablet 3   sildenafil  (VIAGRA ) 100 MG tablet Take 0.5-1 tablets (50-100 mg total) by mouth daily as needed for erectile dysfunction. 4 tablet 14   valsartan -hydrochlorothiazide  (DIOVAN -HCT) 320-25 MG tablet Take 1 tablet by mouth daily. 90 tablet 3   No current  facility-administered medications on file prior to visit.   Allergies  Allergen Reactions   Lipitor [Atorvastatin] Other (See Comments)    Muscle cramps, cant walk, sluggish   Sulfa Antibiotics Itching and Rash   Social History   Socioeconomic History   Marital status: Married    Spouse name: Not on file   Number of children: Not on file   Years of education: Not on file   Highest education level: Not on file  Occupational History   Not on file  Tobacco Use   Smoking status: Never   Smokeless tobacco: Current    Types: Chew  Vaping Use   Vaping status: Never Used  Substance and Sexual Activity   Alcohol use: No   Drug use: No   Sexual activity: Not on file  Other Topics Concern   Not on file  Social History Narrative   Not on file   Social Drivers of Health   Tobacco Use: High Risk (04/18/2024)   Patient History    Smoking Tobacco Use: Never    Smokeless Tobacco Use: Current    Passive Exposure: Not on file  Financial Resource Strain: Low Risk (03/02/2024)   Overall Financial Resource Strain (CARDIA)    Difficulty of Paying Living Expenses: Not very hard  Food Insecurity: No Food Insecurity (03/02/2024)   Epic    Worried About Running Out of Food in the Last Year: Never true    Ran Out of Food in the Last Year: Never true  Transportation Needs: No Transportation Needs (02/25/2023)   PRAPARE - Transportation    Lack of Transportation (  Medical): No    Lack of Transportation (Non-Medical): No  Physical Activity: Sufficiently Active (03/02/2024)   Exercise Vital Sign    Days of Exercise per Week: 5 days    Minutes of Exercise per Session: 50 min  Stress: No Stress Concern Present (03/02/2024)   [employer] of Occupational Health - Occupational Stress Questionnaire    Feeling of Stress: Not at all  Social Connections: Moderately Integrated (03/02/2024)   Social Connection and Isolation Panel    Frequency of Communication with Friends and Family: More than three  times a week    Frequency of Social Gatherings with Friends and Family: Three times a week    Attends Religious Services: More than 4 times per year    Active Member of Clubs or Organizations: No    Attends Banker Meetings: Never    Marital Status: Married  Catering Manager Violence: Not At Risk (03/02/2024)   Epic    Fear of Current or Ex-Partner: No    Emotionally Abused: No    Physically Abused: No    Sexually Abused: No  Depression (PHQ2-9): Low Risk (03/02/2024)   Depression (PHQ2-9)    PHQ-2 Score: 0  Alcohol Screen: Low Risk (03/02/2024)   Alcohol Screen    Last Alcohol Screening Score (AUDIT): 3  Housing: Unknown (03/02/2024)   Epic    Unable to Pay for Housing in the Last Year: No    Number of Times Moved in the Last Year: Not on file    Homeless in the Last Year: No  Utilities: Not At Risk (03/02/2024)   Epic    Threatened with loss of utilities: No  Health Literacy: Adequate Health Literacy (03/02/2024)   B1300 Health Literacy    Frequency of need for help with medical instructions: Never     Review of Systems  All other systems reviewed and are negative.      Objective:   Physical Exam Vitals reviewed.  Constitutional:      General: He is not in acute distress.    Appearance: Normal appearance. He is normal weight. He is not ill-appearing.  Cardiovascular:     Rate and Rhythm: Normal rate and regular rhythm.     Pulses: Normal pulses.     Heart sounds: Normal heart sounds. No murmur heard.    No friction rub. No gallop.  Pulmonary:     Effort: Pulmonary effort is normal. No respiratory distress.     Breath sounds: Normal breath sounds. No wheezing, rhonchi or rales.  Musculoskeletal:     Right lower leg: No edema.     Left lower leg: No edema.  Neurological:     Mental Status: He is alert.           Assessment & Plan:  Benign essential HTN  Hypertension associated with type 2 diabetes mellitus (HCC) Microalbumin to creatinine  ratio is outstanding.  Cholesterol is outstanding.  A1c is excellent.  For these reasons I believe the patient can discontinue Farxiga .  We will increase Mounjaro  to 15 mg subcu weekly.  The remainder of his lab work is excellent.  We discussed a referral for a sleep study due to polycythemia but he declined that at the present time

## 2024-07-27 ENCOUNTER — Other Ambulatory Visit: Payer: Self-pay | Admitting: Family Medicine

## 2024-07-27 DIAGNOSIS — I10 Essential (primary) hypertension: Secondary | ICD-10-CM

## 2024-08-04 ENCOUNTER — Other Ambulatory Visit: Payer: Self-pay | Admitting: Family Medicine

## 2024-08-04 DIAGNOSIS — J302 Other seasonal allergic rhinitis: Secondary | ICD-10-CM

## 2024-08-04 DIAGNOSIS — E7800 Pure hypercholesterolemia, unspecified: Secondary | ICD-10-CM

## 2024-08-04 DIAGNOSIS — I10 Essential (primary) hypertension: Secondary | ICD-10-CM

## 2024-08-11 ENCOUNTER — Encounter: Payer: Self-pay | Admitting: *Deleted

## 2024-08-11 NOTE — Progress Notes (Signed)
 Susumu Hackler                                          MRN: [id number]   08/11/2024   The [facility] Quality Team Specialist reviewed this patient medical record for the purposes of chart review for care gap closure. The following were reviewed: abstraction for care gap closure-glycemic status assessment.    [facility] Quality Team

## 2024-12-15 ENCOUNTER — Ambulatory Visit: Admitting: Family Medicine

## 2025-03-08 ENCOUNTER — Encounter
